# Patient Record
Sex: Male | Born: 1940 | Race: White | Hispanic: No | Marital: Married | Smoking: Former smoker
Health system: Southern US, Community
[De-identification: ages and names within clinical notes are randomized; demographics above are authoritative.]

## PROBLEM LIST (undated history)

## (undated) DIAGNOSIS — M1990 Unspecified osteoarthritis, unspecified site: Secondary | ICD-10-CM

## (undated) DIAGNOSIS — K219 Gastro-esophageal reflux disease without esophagitis: Secondary | ICD-10-CM

## (undated) DIAGNOSIS — D126 Benign neoplasm of colon, unspecified: Secondary | ICD-10-CM

## (undated) DIAGNOSIS — R739 Hyperglycemia, unspecified: Secondary | ICD-10-CM

## (undated) DIAGNOSIS — K5730 Diverticulosis of large intestine without perforation or abscess without bleeding: Secondary | ICD-10-CM

## (undated) DIAGNOSIS — G4730 Sleep apnea, unspecified: Secondary | ICD-10-CM

## (undated) DIAGNOSIS — M722 Plantar fascial fibromatosis: Secondary | ICD-10-CM

## (undated) DIAGNOSIS — E785 Hyperlipidemia, unspecified: Secondary | ICD-10-CM

## (undated) DIAGNOSIS — H269 Unspecified cataract: Secondary | ICD-10-CM

## (undated) DIAGNOSIS — G4733 Obstructive sleep apnea (adult) (pediatric): Secondary | ICD-10-CM

## (undated) DIAGNOSIS — T4145XA Adverse effect of unspecified anesthetic, initial encounter: Secondary | ICD-10-CM

## (undated) HISTORY — PX: CATARACT EXTRACTION, BILATERAL: SHX1313

## (undated) HISTORY — DX: Unspecified cataract: H26.9

## (undated) HISTORY — DX: Sleep apnea, unspecified: G47.30

## (undated) HISTORY — PX: SHOULDER SURGERY: SHX246

## (undated) HISTORY — DX: Obstructive sleep apnea (adult) (pediatric): G47.33

## (undated) HISTORY — DX: Benign neoplasm of colon, unspecified: D12.6

## (undated) HISTORY — DX: Unspecified osteoarthritis, unspecified site: M19.90

## (undated) HISTORY — DX: Diverticulosis of large intestine without perforation or abscess without bleeding: K57.30

## (undated) HISTORY — DX: Gastro-esophageal reflux disease without esophagitis: K21.9

## (undated) HISTORY — PX: SEPTOPLASTY: SUR1290

## (undated) HISTORY — DX: Hyperglycemia, unspecified: R73.9

## (undated) HISTORY — PX: OTHER SURGICAL HISTORY: SHX169

## (undated) HISTORY — DX: Hyperlipidemia, unspecified: E78.5

---

## 1991-03-24 ENCOUNTER — Encounter: Payer: Self-pay | Admitting: Family Medicine

## 1995-03-14 ENCOUNTER — Encounter: Payer: Self-pay | Admitting: Family Medicine

## 1995-03-14 LAB — CONVERTED CEMR LAB
Blood Glucose, Fasting: 93 mg/dL
RBC count: 5.02 10*6/uL
TSH: 2.64 microintl units/mL
WBC, blood: 4.9 10*3/uL

## 1995-06-02 DIAGNOSIS — E785 Hyperlipidemia, unspecified: Secondary | ICD-10-CM

## 1995-06-02 HISTORY — DX: Hyperlipidemia, unspecified: E78.5

## 1996-06-20 ENCOUNTER — Encounter: Payer: Self-pay | Admitting: Family Medicine

## 1997-07-13 ENCOUNTER — Encounter: Payer: Self-pay | Admitting: Family Medicine

## 1997-07-13 LAB — CONVERTED CEMR LAB: RBC count: 4.71 10*6/uL

## 1998-01-25 ENCOUNTER — Encounter: Payer: Self-pay | Admitting: Family Medicine

## 1998-01-25 LAB — CONVERTED CEMR LAB: RBC count: 5.02 10*6/uL

## 1998-03-12 ENCOUNTER — Encounter: Payer: Self-pay | Admitting: Family Medicine

## 1998-10-15 ENCOUNTER — Ambulatory Visit (HOSPITAL_COMMUNITY): Admission: RE | Admit: 1998-10-15 | Discharge: 1998-10-15 | Payer: Self-pay | Admitting: Family Medicine

## 1998-10-16 ENCOUNTER — Encounter: Payer: Self-pay | Admitting: Family Medicine

## 1998-10-16 LAB — CONVERTED CEMR LAB
Blood Glucose, Fasting: 119 mg/dL
TSH: 1.51 microintl units/mL

## 1999-01-18 ENCOUNTER — Encounter: Payer: Self-pay | Admitting: Family Medicine

## 1999-01-18 LAB — CONVERTED CEMR LAB: Blood Glucose, Fasting: 111 mg/dL

## 1999-04-19 ENCOUNTER — Encounter: Payer: Self-pay | Admitting: Family Medicine

## 1999-04-19 LAB — CONVERTED CEMR LAB: Blood Glucose, Fasting: 115 mg/dL

## 1999-09-01 HISTORY — PX: CIRCUMCISION: SUR203

## 2000-04-30 ENCOUNTER — Encounter: Payer: Self-pay | Admitting: Family Medicine

## 2000-04-30 LAB — CONVERTED CEMR LAB
Blood Glucose, Fasting: 123 mg/dL
PSA: 0.3 ng/mL

## 2000-10-02 ENCOUNTER — Encounter: Payer: Self-pay | Admitting: Family Medicine

## 2001-03-17 ENCOUNTER — Encounter: Payer: Self-pay | Admitting: Family Medicine

## 2003-03-06 ENCOUNTER — Ambulatory Visit (HOSPITAL_COMMUNITY): Admission: RE | Admit: 2003-03-06 | Discharge: 2003-03-06 | Payer: Self-pay | Admitting: Orthopedic Surgery

## 2003-03-06 ENCOUNTER — Ambulatory Visit (HOSPITAL_BASED_OUTPATIENT_CLINIC_OR_DEPARTMENT_OTHER): Admission: RE | Admit: 2003-03-06 | Discharge: 2003-03-06 | Payer: Self-pay | Admitting: Orthopedic Surgery

## 2003-07-20 ENCOUNTER — Encounter: Payer: Self-pay | Admitting: Family Medicine

## 2003-07-20 LAB — CONVERTED CEMR LAB
Microalbumin U total vol: 4.8 mg/L
TSH: 1.77 microintl units/mL

## 2003-08-02 ENCOUNTER — Encounter: Admission: RE | Admit: 2003-08-02 | Discharge: 2003-08-02 | Payer: Self-pay | Admitting: Family Medicine

## 2004-08-28 ENCOUNTER — Ambulatory Visit (HOSPITAL_COMMUNITY): Admission: RE | Admit: 2004-08-28 | Discharge: 2004-08-28 | Payer: Self-pay | Admitting: Orthopedic Surgery

## 2004-08-28 ENCOUNTER — Ambulatory Visit (HOSPITAL_BASED_OUTPATIENT_CLINIC_OR_DEPARTMENT_OTHER): Admission: RE | Admit: 2004-08-28 | Discharge: 2004-08-28 | Payer: Self-pay | Admitting: Orthopedic Surgery

## 2004-09-22 ENCOUNTER — Emergency Department (HOSPITAL_COMMUNITY): Admission: EM | Admit: 2004-09-22 | Discharge: 2004-09-22 | Payer: Self-pay | Admitting: Emergency Medicine

## 2004-09-23 ENCOUNTER — Encounter: Payer: Self-pay | Admitting: Family Medicine

## 2004-09-23 ENCOUNTER — Ambulatory Visit: Payer: Self-pay | Admitting: Family Medicine

## 2004-09-23 LAB — CONVERTED CEMR LAB: WBC, blood: 7.9 10*3/uL

## 2004-09-25 ENCOUNTER — Observation Stay: Payer: Self-pay

## 2004-12-01 HISTORY — PX: KNEE ARTHROSCOPY: SUR90

## 2005-03-03 DIAGNOSIS — K5730 Diverticulosis of large intestine without perforation or abscess without bleeding: Secondary | ICD-10-CM

## 2005-03-03 HISTORY — DX: Diverticulosis of large intestine without perforation or abscess without bleeding: K57.30

## 2005-03-04 ENCOUNTER — Ambulatory Visit: Payer: Self-pay | Admitting: Internal Medicine

## 2005-03-10 ENCOUNTER — Encounter (INDEPENDENT_AMBULATORY_CARE_PROVIDER_SITE_OTHER): Payer: Self-pay | Admitting: Specialist

## 2005-03-10 ENCOUNTER — Ambulatory Visit: Payer: Self-pay | Admitting: Internal Medicine

## 2005-03-12 ENCOUNTER — Encounter: Admission: RE | Admit: 2005-03-12 | Discharge: 2005-03-12 | Payer: Self-pay | Admitting: *Deleted

## 2005-03-14 ENCOUNTER — Ambulatory Visit (HOSPITAL_BASED_OUTPATIENT_CLINIC_OR_DEPARTMENT_OTHER): Admission: RE | Admit: 2005-03-14 | Discharge: 2005-03-14 | Payer: Self-pay | Admitting: *Deleted

## 2005-10-01 DIAGNOSIS — K219 Gastro-esophageal reflux disease without esophagitis: Secondary | ICD-10-CM

## 2005-10-01 HISTORY — DX: Gastro-esophageal reflux disease without esophagitis: K21.9

## 2005-10-18 ENCOUNTER — Emergency Department (HOSPITAL_COMMUNITY): Admission: EM | Admit: 2005-10-18 | Discharge: 2005-10-18 | Payer: Self-pay | Admitting: Emergency Medicine

## 2005-10-18 HISTORY — PX: UMBILICAL HERNIA REPAIR: SHX196

## 2005-12-08 ENCOUNTER — Ambulatory Visit: Payer: Self-pay | Admitting: Internal Medicine

## 2006-01-06 ENCOUNTER — Ambulatory Visit: Payer: Self-pay | Admitting: Family Medicine

## 2006-01-06 LAB — CONVERTED CEMR LAB
Alkaline Phosphatase: 101 units/L (ref 39–117)
Blood Glucose, Fasting: 117 mg/dL
Chol/HDL Ratio, serum: 4.2
Cholesterol: 170 mg/dL (ref 0–200)
Creatinine,U: 274.4 mg/dL
Glucose, Bld: 117 mg/dL — ABNORMAL HIGH (ref 70–99)
HDL: 40.9 mg/dL (ref >39.0)
Microalb Creat Ratio: 4 mg/g (ref 0.0–30.0)
Microalb, Ur: 1.1 mg/dL (ref 0.0–1.9)
TSH: 1.58 microintl units/mL (ref 0.35–5.50)
Total Protein: 6.5 g/dL (ref 6.0–8.3)
VLDL: 13 mg/dL (ref 0–40)

## 2006-01-08 ENCOUNTER — Ambulatory Visit: Payer: Self-pay | Admitting: Family Medicine

## 2006-03-17 ENCOUNTER — Ambulatory Visit: Payer: Self-pay | Admitting: Family Medicine

## 2006-04-13 ENCOUNTER — Ambulatory Visit: Payer: Self-pay | Admitting: Family Medicine

## 2006-05-18 ENCOUNTER — Ambulatory Visit: Payer: Self-pay | Admitting: Family Medicine

## 2006-08-28 ENCOUNTER — Telehealth: Payer: Self-pay | Admitting: Family Medicine

## 2006-09-02 ENCOUNTER — Ambulatory Visit: Payer: Self-pay | Admitting: Family Medicine

## 2006-09-02 LAB — CONVERTED CEMR LAB
ALT: 27 units/L (ref 0–53)
AST: 27 units/L (ref 0–37)
Cholesterol: 170 mg/dL (ref 0–200)

## 2006-09-07 ENCOUNTER — Telehealth: Payer: Self-pay | Admitting: Family Medicine

## 2006-10-21 ENCOUNTER — Ambulatory Visit: Payer: Self-pay | Admitting: Family Medicine

## 2006-12-12 ENCOUNTER — Encounter: Payer: Self-pay | Admitting: Family Medicine

## 2006-12-12 ENCOUNTER — Ambulatory Visit: Payer: Self-pay | Admitting: Family Medicine

## 2006-12-16 ENCOUNTER — Ambulatory Visit: Payer: Self-pay | Admitting: Family Medicine

## 2006-12-16 LAB — CONVERTED CEMR LAB: Hgb A1c MFr Bld: 5.9 % (ref 4.6–6.0)

## 2007-01-04 ENCOUNTER — Ambulatory Visit: Payer: Self-pay | Admitting: Family Medicine

## 2007-01-24 ENCOUNTER — Emergency Department (HOSPITAL_COMMUNITY): Admission: EM | Admit: 2007-01-24 | Discharge: 2007-01-24 | Payer: Self-pay | Admitting: Family Medicine

## 2007-04-27 ENCOUNTER — Ambulatory Visit: Payer: Self-pay | Admitting: Family Medicine

## 2007-04-27 LAB — CONVERTED CEMR LAB
ALT: 25 units/L (ref 0–53)
AST: 24 units/L (ref 0–37)
Calcium: 9.5 mg/dL (ref 8.4–10.5)
Chloride: 103 meq/L (ref 96–112)
Creatinine,U: 132.2 mg/dL
GFR calc non Af Amer: 71 mL/min
Glucose, Bld: 111 mg/dL — ABNORMAL HIGH (ref 70–99)
HDL: 50.2 mg/dL (ref >39.0)
Microalb, Ur: 0.2 mg/dL (ref 0.0–1.9)
Potassium: 4.9 meq/L (ref 3.5–5.1)
Sodium: 140 meq/L (ref 135–145)
TSH: 1.99 microintl units/mL (ref 0.35–5.50)
Triglycerides: 95 mg/dL (ref 0–149)

## 2007-04-29 ENCOUNTER — Ambulatory Visit: Payer: Self-pay | Admitting: Family Medicine

## 2007-04-29 ENCOUNTER — Encounter: Payer: Self-pay | Admitting: Family Medicine

## 2007-04-29 DIAGNOSIS — K5732 Diverticulitis of large intestine without perforation or abscess without bleeding: Secondary | ICD-10-CM

## 2007-04-29 DIAGNOSIS — N400 Enlarged prostate without lower urinary tract symptoms: Secondary | ICD-10-CM | POA: Insufficient documentation

## 2007-04-29 DIAGNOSIS — E785 Hyperlipidemia, unspecified: Secondary | ICD-10-CM

## 2007-04-29 DIAGNOSIS — K219 Gastro-esophageal reflux disease without esophagitis: Secondary | ICD-10-CM | POA: Insufficient documentation

## 2007-04-29 DIAGNOSIS — E749 Disorder of carbohydrate metabolism, unspecified: Secondary | ICD-10-CM | POA: Insufficient documentation

## 2007-05-05 ENCOUNTER — Telehealth: Payer: Self-pay | Admitting: Family Medicine

## 2007-05-11 ENCOUNTER — Ambulatory Visit: Payer: Self-pay | Admitting: Family Medicine

## 2007-05-11 LAB — FECAL OCCULT BLOOD, GUAIAC: Fecal Occult Blood: NEGATIVE

## 2007-05-17 ENCOUNTER — Encounter (INDEPENDENT_AMBULATORY_CARE_PROVIDER_SITE_OTHER): Payer: Self-pay | Admitting: *Deleted

## 2007-06-21 ENCOUNTER — Telehealth: Payer: Self-pay | Admitting: Family Medicine

## 2007-10-25 ENCOUNTER — Ambulatory Visit: Payer: Self-pay | Admitting: Family Medicine

## 2007-10-25 DIAGNOSIS — R739 Hyperglycemia, unspecified: Secondary | ICD-10-CM

## 2007-10-27 ENCOUNTER — Ambulatory Visit: Payer: Self-pay | Admitting: Family Medicine

## 2008-02-09 ENCOUNTER — Telehealth (INDEPENDENT_AMBULATORY_CARE_PROVIDER_SITE_OTHER): Payer: Self-pay | Admitting: *Deleted

## 2008-02-10 ENCOUNTER — Encounter: Payer: Self-pay | Admitting: Family Medicine

## 2008-03-17 ENCOUNTER — Encounter: Payer: Self-pay | Admitting: Family Medicine

## 2008-03-31 ENCOUNTER — Ambulatory Visit: Payer: Self-pay | Admitting: Family Medicine

## 2008-04-03 ENCOUNTER — Telehealth: Payer: Self-pay | Admitting: Family Medicine

## 2008-05-09 ENCOUNTER — Ambulatory Visit: Payer: Self-pay | Admitting: Family Medicine

## 2008-05-09 LAB — CONVERTED CEMR LAB
Albumin: 3.8 g/dL (ref 3.5–5.2)
Alkaline Phosphatase: 69 units/L (ref 39–117)
BUN: 18 mg/dL (ref 6–23)
Basophils Relative: 0.7 % (ref 0.0–3.0)
Calcium: 9.3 mg/dL (ref 8.4–10.5)
Creatinine,U: 237.1 mg/dL
Eosinophils Absolute: 0.1 10*3/uL (ref 0.0–0.7)
Eosinophils Relative: 3 % (ref 0.0–5.0)
HCT: 43.8 % (ref 39.0–52.0)
Hemoglobin: 14.9 g/dL (ref 13.0–17.0)
Hgb A1c MFr Bld: 5.9 % (ref 4.6–6.0)
MCV: 90.4 fL (ref 78.0–100.0)
Microalb Creat Ratio: 1.3 mg/g (ref 0.0–30.0)
Neutro Abs: 1.9 10*3/uL (ref 1.4–7.7)
Neutrophils Relative %: 49.4 % (ref 43.0–77.0)
PSA: 0.37 ng/mL (ref 0.10–4.00)
Phosphorus: 4.2 mg/dL (ref 2.3–4.6)
Platelets: 146 10*3/uL — ABNORMAL LOW (ref 150–400)
Total Bilirubin: 1.1 mg/dL (ref 0.3–1.2)
Total Protein: 6.7 g/dL (ref 6.0–8.3)
Triglycerides: 112 mg/dL (ref 0–149)
WBC: 3.8 10*3/uL — ABNORMAL LOW (ref 4.5–10.5)

## 2008-05-15 ENCOUNTER — Ambulatory Visit: Payer: Self-pay | Admitting: Family Medicine

## 2008-08-21 ENCOUNTER — Telehealth: Payer: Self-pay | Admitting: Family Medicine

## 2008-11-15 ENCOUNTER — Ambulatory Visit: Payer: Self-pay | Admitting: Family Medicine

## 2008-11-19 LAB — CONVERTED CEMR LAB: Hgb A1c MFr Bld: 5.9 % (ref 4.6–6.5)

## 2008-11-20 ENCOUNTER — Ambulatory Visit: Payer: Self-pay | Admitting: Family Medicine

## 2009-02-16 ENCOUNTER — Ambulatory Visit: Payer: Self-pay | Admitting: Family Medicine

## 2009-02-20 ENCOUNTER — Telehealth (INDEPENDENT_AMBULATORY_CARE_PROVIDER_SITE_OTHER): Payer: Self-pay | Admitting: Internal Medicine

## 2009-03-23 ENCOUNTER — Ambulatory Visit (HOSPITAL_COMMUNITY): Admission: RE | Admit: 2009-03-23 | Discharge: 2009-03-23 | Payer: Self-pay | Admitting: Orthopedic Surgery

## 2009-03-30 ENCOUNTER — Encounter: Payer: Self-pay | Admitting: Family Medicine

## 2009-04-05 ENCOUNTER — Ambulatory Visit: Payer: Self-pay | Admitting: Otolaryngology

## 2009-04-18 ENCOUNTER — Ambulatory Visit: Payer: Self-pay | Admitting: Otolaryngology

## 2009-05-14 ENCOUNTER — Ambulatory Visit: Payer: Self-pay | Admitting: Family Medicine

## 2009-05-14 LAB — CONVERTED CEMR LAB
ALT: 29 units/L (ref 0–53)
AST: 29 units/L (ref 0–37)
Alkaline Phosphatase: 87 units/L (ref 39–117)
Basophils Relative: 0.5 % (ref 0.0–3.0)
Bilirubin, Direct: 0.1 mg/dL (ref 0.0–0.3)
Calcium: 9.2 mg/dL (ref 8.4–10.5)
Chloride: 109 meq/L (ref 96–112)
Creatinine,U: 175.2 mg/dL
Eosinophils Relative: 3.2 % (ref 0.0–5.0)
GFR calc non Af Amer: 79.11 mL/min (ref >60)
Hemoglobin: 14.4 g/dL (ref 13.0–17.0)
Hgb A1c MFr Bld: 5.9 % (ref 4.6–6.5)
LDL Cholesterol: 100 mg/dL — ABNORMAL HIGH (ref 0–99)
Lymphocytes Relative: 36.9 % (ref 12.0–46.0)
Lymphs Abs: 1.5 10*3/uL (ref 0.7–4.0)
Microalb Creat Ratio: 3.4 mg/g (ref 0.0–30.0)
Microalb, Ur: 0.6 mg/dL (ref 0.0–1.9)
Monocytes Relative: 13.1 % — ABNORMAL HIGH (ref 3.0–12.0)
Neutro Abs: 1.9 10*3/uL (ref 1.4–7.7)
Neutrophils Relative %: 46.3 % (ref 43.0–77.0)
PSA: 0.42 ng/mL (ref 0.10–4.00)
Platelets: 142 10*3/uL — ABNORMAL LOW (ref 150.0–400.0)
Potassium: 4.7 meq/L (ref 3.5–5.1)
RBC: 4.74 M/uL (ref 4.22–5.81)
Total Bilirubin: 0.9 mg/dL (ref 0.3–1.2)
WBC: 4 10*3/uL — ABNORMAL LOW (ref 4.5–10.5)

## 2009-05-17 ENCOUNTER — Ambulatory Visit: Payer: Self-pay | Admitting: Family Medicine

## 2009-07-17 ENCOUNTER — Ambulatory Visit: Payer: Self-pay | Admitting: Family Medicine

## 2009-07-17 DIAGNOSIS — M7020 Olecranon bursitis, unspecified elbow: Secondary | ICD-10-CM

## 2009-07-19 ENCOUNTER — Ambulatory Visit: Payer: Self-pay | Admitting: Family Medicine

## 2009-10-04 ENCOUNTER — Encounter (INDEPENDENT_AMBULATORY_CARE_PROVIDER_SITE_OTHER): Payer: Self-pay | Admitting: *Deleted

## 2009-12-03 ENCOUNTER — Ambulatory Visit: Payer: Self-pay | Admitting: Family Medicine

## 2010-01-17 ENCOUNTER — Ambulatory Visit: Payer: Self-pay | Admitting: Family Medicine

## 2010-01-17 LAB — CONVERTED CEMR LAB: Hgb A1c MFr Bld: 6.2 % (ref 4.6–6.5)

## 2010-02-11 ENCOUNTER — Encounter: Payer: Self-pay | Admitting: Family Medicine

## 2010-03-13 ENCOUNTER — Encounter: Payer: Self-pay | Admitting: Internal Medicine

## 2010-03-15 ENCOUNTER — Encounter (INDEPENDENT_AMBULATORY_CARE_PROVIDER_SITE_OTHER): Payer: Self-pay | Admitting: *Deleted

## 2010-04-04 NOTE — Assessment & Plan Note (Signed)
Summary: 6 MTH FU/CLE   Vital Signs:  Patient profile:   70 year old male Weight:      250 pounds Temp:     98.2 degrees F oral Pulse rate:   60 / minute Pulse rhythm:   regular BP sitting:   110 / 72  (left arm) Cuff size:   large  Vitals Entered By: Sydell Axon LPN (January 17, 2010 9:16 AM) CC: 6 Month follow-up   History of Present Illness: Pt here for 6 mos recheck. He feels well and is doing ok.  He has stopped curls on a cushion.  He occas has pins into LLQ abd area. He thinks it might be after eating peanuts...poss diverticular irritation.   Allergies: No Known Drug Allergies  Diabetes Management Exam:    Foot Exam (with socks and/or shoes not present):       Sensory-Pinprick/Light touch:          Left medial foot (L-4): normal          Left dorsal foot (L-5): normal          Left lateral foot (S-1): normal          Right medial foot (L-4): normal          Right dorsal foot (L-5): normal          Right lateral foot (S-1): normal       Sensory-Monofilament:          Left foot: normal          Right foot: normal       Inspection:          Left foot: normal          Right foot: normal       Nails:          Left foot: normal          Right foot: normal   Impression & Recommendations:  Problem # 1:  AODM (ICD-250.00) Assessment Unchanged  Does not check sugar as control has been great. Will recheck today, if A1C less than 6, cont as is. If >6, start monitoring. His updated medication list for this problem includes:    Bayer Low Strength 81 Mg Tbec (Aspirin) .[name]... 1daily by mouth  Orders: Venipuncture (38756) TLB-A1C / Hgb A1C (Glycohemoglobin) (83036-A1C)  Labs Reviewed: Creat: 1.0 (05/14/2009)   Microalbumin: 4.0 (01/06/2006)  Last Eye Exam: normal (10/11/2008) Reviewed HgBA1c results: 5.9 (05/14/2009)  5.9 (11/15/2008)  Problem # 2:  OLECRANON BURSITIS, RIGHT (ICD-726.33) Assessment: Improved Doing better.  Problem # 3:  DIVERTICULOSIS, COLON  (ICD-562.10) Assessment: Unchanged Avoid nuts!  Complete Medication List: 1)  Bayer Low Strength 81 Mg Tbec (Aspirin) .[name].. 1daily by mouth 2)  Prilosec Otc 20 Mg Tbec (Omeprazole magnesium) .... As needed // 3)  Multivitamins Tabs (Multiple vitamin) .[name].. 1 daily by mouth 4)  Pravastatin Sodium 80 Mg Tabs (Pravastatin sodium) .[name].. 1 at bedtime daily by mouth 5)  Celebrex 200 Mg Caps (Celecoxib) .[name].. 1 capsule twice a day as needed 6)  Tylenol Arthritis Pain 650 Mg Cr-tabs (Acetaminophen) .... As needed  Patient Instructions: 1)  RTC 6 mos for Comp Exam, labs prior.   Orders Added: 1)  Venipuncture [36415] 2)  TLB-A1C / Hgb A1C (Glycohemoglobin) [83036-A1C] 3)  Est. Patient Level III [43329]    Current Allergies (reviewed today): No known allergies   Appended Document: 6 MTH FU/CLE     Clinical Lists Changes  Observations: Added new  observation of DMEYEEXAMNXT: 02/2011 (02/13/2010 7:42) Added new observation of DMEYEEXMRES: normal (02/11/2010 7:42) Added new observation of EYE EXAM BY: Dr Orlander Picker (02/11/2010 7:42) Added new observation of DIAB EYE EX: normal (02/11/2010 7:42)        Diabetes Management Exam:    Eye Exam:       Eye Exam done elsewhere          Date: 02/11/2010          Results: normal          Done by: Dr Dwain Picker

## 2010-04-04 NOTE — Letter (Signed)
Summary: Nadara Eaton letter  [place] at [facility]  [address] [place], [place] [address]   Phone: [phone]  Fax: [phone]       10/04/2009 MRN: [id number]  [facility] [place] [address] [place], [place]  [address]  [place]  Dear Sergio [name],  [facility] [place] [facility] [place], and [facility] announce the retirement of Sergio [name], M.D., from full-time practice at the [facility] office effective August 30, 2009 and his plans of returning part-time.  It is important to Sergio [name] and to our practice that you understand that [facility] has seven physicians in our office for your health care needs.  We will continue to offer the same exceptional care that you have today.    Sergio [name] has spoken to many of you about his plans for retirement and returning part-time in the fall.   We will continue to work with you through the transition to schedule appointments for you in the office and meet the high standards that [place] is committed to.   Again, it is with great pleasure that we share the news that Sergio [name] will return to [facility] in October of 2011 with a reduced schedule.    If you have any questions, or would like to request an appointment with one of our physicians, please call us at [phone] and press the option for Scheduling an appointment.  We take pleasure in providing you with excellent patient care and look forward to seeing you at your next office visit.  Our [facility] Physicians are:  Sergio [name], M.D. Sergio [name], M.D. Sergio [name], M.D. Sergio [name], M.D. Sergio [name], M.D. Sergio [name], M.D. We proudly welcomed Sergio [name], M.D. and Sergio [name], M.D. to the practice in July/August 2011.  Sincerely,   [facility]

## 2010-04-04 NOTE — Letter (Signed)
Summary: Dr.Kathryn Hecker,Diabetic and General Eye Exam Report  Dr.Kathryn Hecker,Diabetic and General Eye Exam Report   Imported By: Beau Fanny 02/14/2010 09:36:32  _____________________________________________________________________  External Attachment:    Type:   Image     Comment:   External Document

## 2010-04-04 NOTE — Assessment & Plan Note (Signed)
Summary: CPX / LFW   Vital Signs:  Patient profile:   70 year old male Weight:      256 pounds Temp:     98.7 degrees F oral Pulse rate:   72 / minute Pulse rhythm:   regular BP sitting:   118 / 78  (left arm) Cuff size:   large  Vitals Entered By: Sydell Axon LPN (2009-06-08 8:27 AM) CC: 30 Minute checkup, had a colonoscopy 01/07 by Dr. Juanda Chance   History of Present Illness: Pt here for Comp Exam, has lost 8 pounds since last visit. He has cut out sweets and has lost. His wife is trying to help. His glucose is up slightly. He had surgery by ENT and the lymph node had abnormal activity from past infections...removed a bunch of lymph nodes.   Preventive Screening-Counseling & Management  Alcohol-Tobacco     Alcohol drinks/day: 1     Alcohol type: wine occas martini weekly     >5/day in last 3 mos: no     Smoking Status: quit > 6 months     Packs/Day: <0.25     Year Started: 1980s     Year Quit: 2007     Pack years: cigars  15 per month for 30     Cigars/week: 0     Passive Smoke Exposure: no  Caffeine-Diet-Exercise     Caffeine use/day: 3     Does Patient Exercise: yes     Type of exercise: aerobic and anaerobic     Exercise (avg: min/session): >60     Times/week: <3  Problems Prior to Update: 1)  Uri  (ICD-465.9) 2)  Need Prophylactic Vaccination&inoculation Flu  (ICD-V04.81) 3)  Aodm  (ICD-250.00) 4)  Special Screening Malig Neoplasms Other Sites  (ICD-V76.49) 5)  Hypertrophy Prostate w/o Ur Obst & Oth Luts (DR WOLFE)  (ICD-600.00) 6)  Gerd  (ICD-530.81) 7)  Hyperlipidemia  (ICD-272.4) 8)  Diverticulosis, Colon  (ICD-562.10) 9)  Health Maintenance Exam  (ICD-V70.0) 10)  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 1daily By Mouth 2)  Prilosec Otc 20 Mg  Tbec (Omeprazole Magnesium) .... As Needed // 3)   Multivitamins   Tabs (Multiple Vitamin) .[name].. 1 Daily By Mouth 4)  Pravastatin Sodium 80 Mg Tabs (Pravastatin Sodium) .[name].. 1 At Bedtime Daily By Mouth 5)  Celebrex 200 Mg Caps (Celecoxib) .[name].. 1 Capsule Twice A Day As Needed 6)  Tylenol Arthritis Pain 650 Mg Cr-Tabs (Acetaminophen) .... As Needed 7)  Mucinex Dm Maximum Strength 60-1200 Mg Xr12h-Tab (Dextromethorphan-Guaifenesin) .... Otc As Directed. 8)  Amoxicillin 500 Mg Caps (Amoxicillin) .... Take 2 Two Times A Day  Allergies: No Known Drug Allergies  Past History:  Past Medical History: Last updated: 04/29/2007 Diverticulosis, colon:(03/2005)  VIA COLONOSCOPY Hyperlipidemia: 06/1995) STARTED PRAVACHOL GERD:(10/2005)  Past Surgical History: Last updated: 02/20/2009 MEDIASTINOSCOPY COLONOSCOPY SCREENING --NORMAL :(DR. Maryruth Bun) :(1991) PALATOPLASTY, TONSILS AND ADENOIDS SEPTOPLASTYDUE SLEEP APNEA CIRCUMCISION (DR WOLFE) 09/1999 LEFT KNEE ARTHRASCOPY (DR. ROWAN):04/2003) MRI BRAIN NORMAL;;--NEGATIVE ACOUSTIC NEUROMA:(08/02/2003) EGD :(03/10/2005) COLONOSCOPY ; POLYP X2 BIOPSY NEGATIVE; DIVERTICULOSIS:(03/10/2005) RIGHT KNEE ARTHROSCOPY:(DR. ROWAN) :(12/2004) UMBIICAL HERNIA REPAIR :(DR. INGRAM):(10/18/2005) CT OF ABDOMEN AND PELVIS SIGMOID DIVERTICULOSIS:(10/18/2005) zostervax given at pharmacy--02/20/2009  Family History: Last updated: 08-Jun-2009 Father:DECEASED AT 86 YOA DM; BURN INFECTION/ PARTIAL AMPUTEE L FOOT   Mother: DECEASED AT 46 YOA CANCER OF LIVER  DIABETIC /GLUCOSE  INTOLERANCE//  BREAST CANCER 6 YEARS PREVIOUSLY BROTHER A (Jim) 70 BROTHER A Bernette Redbird) 63 BROTHER A (Danny) 61 SISTER DECEASED 69 YOA FROM M.S.  SISTER A (Debbi) 59 CV: +GF X 3 IN HIS 90'S HBP: NEGATIVE DM: + DM FATHER // GLUCOSE INTOLERANCE MOTHER GOUT/ARTHRITIS: PROSTATE CANCER: BREAST CANCER + MOTHER// +MOTHER LIVER CANCER COLON CANCER: DEPRESSION: NEGATIVE ETOH/DRUG ABUSE: NEGATIVE OTHER: NEGATIVE STROKE  Social History: Last updated:  04/29/2007 Marital Status: Married DIVORCED// REMARRIED 03/1995 Children: 2 CHILDREN Occupation: RETIRED FROM [employer]  Risk Factors: Alcohol Use: 1 (05/17/2009) >5 drinks/d w/in last 3 months: no (05/17/2009) Caffeine Use: 3 (05/17/2009) Exercise: yes (05/17/2009)  Risk Factors: Smoking Status: quit > 6 months (05/17/2009) Packs/Day: <0.25 (05/17/2009) Cigars/wk: 0 (05/17/2009) Passive Smoke Exposure: no (05/17/2009)  Family History: Father:DECEASED AT 88 YOA DM; BURN INFECTION/ PARTIAL AMPUTEE L FOOT   Mother: DECEASED AT 76 YOA CANCER OF LIVER  DIABETIC /GLUCOSE INTOLERANCE//  BREAST CANCER 6 YEARS PREVIOUSLY BROTHER A (Jim) 70 BROTHER A Bernette Redbird) 63 BROTHER A (Danny) 61 SISTER DECEASED 69 YOA FROM M.S.  SISTER A (Debbi) 59 CV: +GF X 3 IN HIS 90'S HBP: NEGATIVE DM: + DM FATHER // GLUCOSE INTOLERANCE MOTHER GOUT/ARTHRITIS: PROSTATE CANCER: BREAST CANCER + MOTHER// +MOTHER LIVER CANCER COLON CANCER: DEPRESSION: NEGATIVE ETOH/DRUG ABUSE: NEGATIVE OTHER: NEGATIVE STROKE  Review of Systems General:  Denies chills, fatigue, fever, sweats, weakness, and weight loss. Eyes:  Denies blurring, discharge, and eye pain. ENT:  Denies decreased hearing, ear discharge, and earache. CV:  Denies chest pain or discomfort, fainting, fatigue, palpitations, shortness of breath with exertion, and swelling of feet. Resp:  Denies cough, shortness of breath, and wheezing. GI:  Denies abdominal pain, bloody stools, change in bowel habits, constipation, dark tarry stools, diarrhea, indigestion, loss of appetite, nausea, vomiting, vomiting blood, and yellowish skin color. GU:  Denies discharge, dysuria, nocturia, and urinary frequency. MS:  Complains of joint pain and muscle aches; denies joint swelling, cramps, and stiffness; L hip joint, left calf laterally throbs. Derm:  Denies dryness, itching, and rash. Neuro:  Denies numbness, poor balance, tingling, and  tremors.  Physical Exam  General:  Well-developed,well-nourished,in no acute distress; alert,appropriate and cooperative throughout examination, stocky but not obese, thinner than last visit! Head:  Normocephalic and atraumatic without obvious abnormalities. No apparent alopecia or balding. Sinuses nontender. Eyes:  Conjunctiva clear bilaterally.  Ears:  External ear exam shows no significant lesions or deformities.  Otoscopic examination reveals clear canals, tympanic membranes are intact bilaterally without bulging, retraction, inflammation or discharge. Hearing is grossly normal bilaterally. Nose:  External nasal examination shows no deformity or inflammation. Nasal mucosa are pink and moist without lesions or exudates. Mouth:  Oral mucosa and oropharynx without lesions or exudates.  Teeth in good repair. Neck:  No deformities, masses, or tenderness noted. Surgical scar along the right ant cerv chain. Chest Wall:  No deformities, masses, tenderness or gynecomastia noted. Breasts:  No masses or gynecomastia noted Lungs:  Normal respiratory effort, chest expands symmetrically. Lungs are clear to auscultation, no crackles or wheezes. Heart:  Normal rate and regular rhythm. S1 and S2 normal without gallop, murmur, click, rub or other extra sounds. Abdomen:  Bowel sounds positive,abdomen soft and non-tender without masses, organomegaly or hernias noted. Rectal:  No external abnormalities noted. Normal sphincter tone. No rectal masses or tenderness. G neg. Genitalia:  Testes bilaterally descended without nodularity, tenderness or masses. No scrotal masses or lesions. No penis lesions or urethral discharge. Prostate:  Prostate gland firm and smooth, no enlargement, nodularity, tenderness, mass, asymmetry or induration. 10-20gms. Msk:  No deformity or scoliosis noted of thoracic or lumbar spine.   Pulses:  R and L carotid,radial,femoral,dorsalis pedis and posterior tibial pulses are full and equal  bilaterally Extremities:  No clubbing, cyanosis, edema, or deformity noted with normal full range of motion of all joints.   Neurologic:  No cranial nerve deficits noted. Station and gait are normal. Sensory, motor and coordinative functions appear intact. Skin:  Intact without suspicious lesions or rashes. Fresh surgical scar in right cervical ant chain Cervical Nodes:  No lymphadenopathy noted Inguinal Nodes:  No significant adenopathy Psych:  Cognition and judgment appear intact. Alert and cooperative with normal attention span and concentration. No apparent delusions, illusions, hallucinations  Diabetes Management Exam:    Foot Exam (with socks and/or shoes not present):       Sensory-Pinprick/Light touch:          Left medial foot (L-4): normal          Left dorsal foot (L-5): normal          Left lateral foot (S-1): normal          Right medial foot (L-4): normal          Right dorsal foot (L-5): normal          Right lateral foot (S-1): normal       Sensory-Monofilament:          Left foot: normal          Right foot: normal       Inspection:          Left foot: normal          Right foot: normal       Nails:          Left foot: normal          Right foot: normal    Eye Exam:       Eye Exam done elsewhere          Date: 10/11/2008          Results: normal          Done by: Dr Robbert Picker   Impression & Recommendations:  Problem # 1:  HEALTH MAINTENANCE EXAM (ICD-V70.0) Pneum shot today. Otherimmun UTD. Eye and foot exam UTD. Discussed Diet and exercise.  Problem # 2:  AODM (ICD-250.00) Assessment: Improved Doing great. [place] is to cont. His updated medication list for this problem includes:    Bayer Low Strength 81 Mg Tbec (Aspirin) .[name]... 1daily by mouth  Labs Reviewed: Creat: 1.0 (05/14/2009)   Microalbumin: 4.0 (01/06/2006)  Last Eye Exam: normal (10/11/2008) Reviewed HgBA1c results: 5.9 (05/14/2009)  5.9 (11/15/2008)  Problem # 3:  HYPERTROPHY PROSTATE  W/O UR OBST & OTH LUTS (DR WOLFE) (ICD-600.00) Assessment: Unchanged Stable PSA and exam.  Problem # 4:  GERD (ICD-530.81) Assessment: Unchanged  Stable. His updated medication list for this problem includes:    Prilosec Otc 20 Mg Tbec (Omeprazole magnesium) .[name]... As needed //  Diagnostics Reviewed:  Discussed lifestyle modifications, diet, antacids/medications, and preventive measures. Handout provided.   Problem # 5:  HYPERLIPIDEMIA (ICD-272.4) Assessment: Unchanged Excellent. Improved since his diet and exercise changes. His updated medication list for this problem includes:    Pravastatin Sodium 80 Mg Tabs (Pravastatin sodium) .[name]... 1 at bedtime daily by mouth  Labs Reviewed: SGOT: 29 (05/14/2009)   SGPT: 29 (05/14/2009)   HDL:50.30 (  05/14/2009), 40.2 (05/09/2008)  LDL:100 (05/14/2009), 133 (05/09/2008)  Chol:181 (05/14/2009), 196 (05/09/2008)  Trig:152.0 (05/14/2009), 112 (05/09/2008)  Problem # 6:  DIVERTICULOSIS, COLON (ICD-562.10) Assessment: Unchanged Discussed being seen for discomfort of the LLQ for more than two days.  Complete Medication List: 1)  Bayer Low Strength 81 Mg Tbec (Aspirin) .[name].. 1daily by mouth 2)  Prilosec Otc 20 Mg Tbec (Omeprazole magnesium) .... As needed // 3)  Multivitamins Tabs (Multiple vitamin) .[name].. 1 daily by mouth 4)  Pravastatin Sodium 80 Mg Tabs (Pravastatin sodium) .[name].. 1 at bedtime daily by mouth 5)  Celebrex 200 Mg Caps (Celecoxib) .[name].. 1 capsule twice a day as needed 6)  Tylenol Arthritis Pain 650 Mg Cr-tabs (Acetaminophen) .... As needed  Patient Instructions: 1)  Pneumonia shot today. 2)  RTC in the Fall.  Current Allergies (reviewed today): No known allergies   Appended Document: CPX / LFW   Pneumovax Vaccine    Vaccine Type: Pneumovax (Medicare)    Site: left deltoid    Mfr: Merck    Dose: 0.5 ml    Route: IM    Given by: Sydell Axon LPN    Exp. Date: 10/15/2010    Lot #: [id number]    VIS given: 09/29/95 version given  May 17, 2009.

## 2010-04-04 NOTE — Letter (Signed)
Summary: Pre Visit Letter Revised  [facility]  [address] [place], [place] [address]   Phone: [phone]  Fax: [phone]        03/15/2010 MRN: [id number] [facility] [address] [place], [place]  [address]  [place]             Procedure Date:  April 25, 2010   recall col-Dr Dalene Carrow to the Gastroenterology Division at [facility].    You are scheduled to see a nurse for your pre-procedure visit on April 11, 2010 at 4:30pm on the 3rd floor at [facility], [address]  We ask that you try to arrive at our office 15 minutes prior to your appointment time to allow for check-in.  Please take a minute to review the attached form.  If you answer "Yes" to one or more of the questions on the first page, we ask that you call the person listed at your earliest opportunity.  If you answer "No" to all of the questions, please complete the rest of the form and bring it to your appointment.    Your nurse visit will consist of discussing your medical and surgical history, your immediate family medical history, and your medications.   If you are unable to list all of your medications on the form, please bring the medication bottles to your appointment and we will list them.  We will need to be aware of both prescribed and over the counter drugs.  We will need to know exact dosage information as well.    Please be prepared to read and sign documents such as consent forms, a financial agreement, and acknowledgement forms.  If necessary, and with your consent, a friend or relative is welcome to sit-in on the nurse visit with you.  Please bring your insurance card so that we may make a copy of it.  If your insurance requires a referral to see a specialist, please bring your referral form from your primary care physician.  No co-pay is required for this nurse visit.     If you cannot keep your appointment, please call [phone] to cancel or  reschedule prior to your appointment date.  This allows [place] the opportunity to schedule an appointment for another patient in need of care.    Thank you for choosing [facility] for your medical needs.  We appreciate the opportunity to care for you.  Please visit [place] at our website  to learn more about our practice.  Sincerely, The Gastroenterology Division

## 2010-04-04 NOTE — Assessment & Plan Note (Signed)
Summary: ACUTE RIGHT OLECRANON BURSITIS PER DR SCHALLER/[facility]   Vital Signs:  Patient profile:   70 year old male Height:      72 inches Weight:      253.0 pounds BMI:     34.44 Temp:     98.4 degrees F oral Pulse rate:   64 / minute Pulse rhythm:   regular BP sitting:   100 / 70  (left arm) Cuff size:   large  Vitals Entered By: Benny Lennert CMA Duncan Dull) (Jul 19, 2009 10:07 AM)  History of Present Illness: Chief complaint Acute right olecranon bursitis  R 4 day history of elbow swelling, seen at the request of Dr. Hetty Ely:  The patient's elbow does not hurt at all. It is not red or warm in any way. His right elbow, however does have a boggy, fluid-filled sac. He does not recall any specific trauma or incident. He did notice this while he was at the gym, and working out. Actually, one of the trainers at the gymnasium, noticed that he did have this swelling protuberance.  He has never had this in the past.  REVIEW OF SYSTEMS  GEN: No systemic complaints, no fevers, chills, sweats, or other acute illnesses MSK: Detailed in the HPI GI: tolerating PO intake without difficulty Neuro: No numbness, parasthesias, or tingling associated. Otherwise the pertinent positives of the ROS are noted above.    GEN: Well-developed,well-nourished,in no acute distress; alert,appropriate and cooperative throughout examination HEENT: Normocephalic and atraumatic without obvious abnormalities. No apparent alopecia or balding. Ears, externally no deformities PULM: Breathing comfortably in no respiratory distress EXT: No clubbing, cyanosis, or edema PSYCH: Normally interactive. Cooperative during the interview. Pleasant. Friendly and conversant. Not anxious or depressed appearing. Normal, full affect.    R elbow Ecchymosis or edema: large, boggy olecranon bursa  on the right ROM: full flexion, extension, pronation, supination Shoulder ROM: Full Flexion: 5/5 Extension: 5/5 Supination: 5/5    Pronation: 5/5 Wrist ext: 5/5 Wrist flexion: 5/5 No gross bony abnormality ECRB tenderness: neg Medial epicondyle: NT Lateral epicondyle, resisted wrist extension from wrist full pronation and flexion: NT grip: 5/5  sensation intact  Allergies (verified): No Known Drug Allergies  Past History:  Past medical, surgical, family and social histories (including risk factors) reviewed, and no changes noted (except as noted below).  Past Medical History: Reviewed history from 04/29/2007 and no changes required. Diverticulosis, colon:(03/2005)  VIA COLONOSCOPY Hyperlipidemia: 06/1995) STARTED PRAVACHOL GERD:(10/2005)  Past Surgical History: Reviewed history from 02/20/2009 and no changes required. MEDIASTINOSCOPY COLONOSCOPY SCREENING --NORMAL :(DR. Maryruth Bun) :(1991) PALATOPLASTY, TONSILS AND ADENOIDS SEPTOPLASTYDUE SLEEP APNEA CIRCUMCISION (DR WOLFE) 09/1999 LEFT KNEE ARTHRASCOPY (DR. ROWAN):04/2003) MRI BRAIN NORMAL;;--NEGATIVE ACOUSTIC NEUROMA:(08/02/2003) EGD :(03/10/2005) COLONOSCOPY ; POLYP X2 BIOPSY NEGATIVE; DIVERTICULOSIS:(03/10/2005) RIGHT KNEE ARTHROSCOPY:(DR. ROWAN) :(12/2004) UMBIICAL HERNIA REPAIR :(DR. INGRAM):(10/18/2005) CT OF ABDOMEN AND PELVIS SIGMOID DIVERTICULOSIS:(10/18/2005) zostervax given at pharmacy--02/20/2009  Family History: Reviewed history from 05/17/2009 and no changes required. Father:DECEASED AT 25 YOA DM; BURN INFECTION/ PARTIAL AMPUTEE L FOOT   Mother: DECEASED AT 29 YOA CANCER OF LIVER  DIABETIC /GLUCOSE INTOLERANCE//  BREAST CANCER 6 YEARS PREVIOUSLY BROTHER A (Jim) 70 BROTHER A Bernette Redbird) 63 BROTHER A (Danny) 61 SISTER DECEASED 69 YOA FROM M.S.  SISTER A (Debbi) 59 CV: +GF X 3 IN HIS 90'S HBP: NEGATIVE DM: + DM FATHER // GLUCOSE INTOLERANCE MOTHER GOUT/ARTHRITIS: PROSTATE CANCER: BREAST CANCER + MOTHER// +MOTHER LIVER CANCER COLON CANCER: DEPRESSION: NEGATIVE ETOH/DRUG ABUSE: NEGATIVE OTHER: NEGATIVE STROKE  Social  History: Reviewed history from 04/29/2007  and no changes required. Marital Status: Married DIVORCED// REMARRIED 03/1995 Children: 2 CHILDREN Occupation: RETIRED FROM [employer], [employer]HRS/YR [employer]   Impression & Recommendations:  Problem # 1:  OLECRANON BURSITIS, RIGHT (ICD-726.33)  classic right-sided olecranon bursitis. No known inciting event. Not warm or red  in the slightest degree. given that it was a bloodytap, and I suspect this is a traumatic olecranon bursitis, and that corticosteroid is injected, I am that I placed the patient also prophylactic Keflex.  Olecranon Bursa Aspiration Verbal consent was obtained. Risks, benefits, and alternatives discussed.  Prepped with Betadine x 3 and Ethyl Chloride used for anesthesia. Under sterile conditions, the effected olecranon bursa was aspirated with an 18 gauge needle. 6 cc amount of bloody fluid was aspirated. 1/2 cc of Marcaine 0.5% and 1/2 of Kenalog 40 mg injected into olecranon bursa easily.  A compression bandage was applied and the patient was instructed to continue for 72 hours. Activity mod for the next week.  cc: Dr. Hetty Ely  Orders: Joint Aspirate / Injection, Intermediate (20605) Kenalog 10mg  (up to 2 units) (J3301)  Complete Medication List: 1)  Bayer Low Strength 81 Mg Tbec (Aspirin) .[name].. 1daily by mouth 2)  Prilosec Otc 20 Mg Tbec (Omeprazole magnesium) .... As needed // 3)  Multivitamins Tabs (Multiple vitamin) .[name].. 1 daily by mouth 4)  Pravastatin Sodium 80 Mg Tabs (Pravastatin sodium) .[name].. 1 at bedtime daily by mouth 5)  Celebrex 200 Mg Caps (Celecoxib) .[name].. 1 capsule twice a day as needed 6)  Tylenol Arthritis Pain 650 Mg Cr-tabs (Acetaminophen) .... As needed 7)  Cephalexin 500 Mg Tabs (Cephalexin) .... Take one by mouth 3 times daily Prescriptions: CEPHALEXIN 500 MG  TABS (CEPHALEXIN) take one by mouth 3 times daily  #30 x 0   Entered and Authorized by:   Hannah Beat MD   Signed by:   Hannah Beat MD on 07/19/2009   Method used:   Electronically to        [facility]* (retail)       [address]       [place], [place]  [address]       Ph: [phone]       Fax: [phone]   RxID:   [id number]   Current Allergies (reviewed today): No known allergies

## 2010-04-04 NOTE — Assessment & Plan Note (Signed)
Summary: CHECK ELBOW/CLE   Vital Signs:  Patient profile:   70 year old male Weight:      253.75 pounds Temp:     98.6 degrees F oral Pulse rate:   60 / minute Pulse rhythm:   regular BP sitting:   110 / 70  (left arm) Cuff size:   large  Vitals Entered By: Sydell Axon LPN (Jul 17, 2009 11:30 AM) CC: Check right elbow, a sac of fluid popped up yesterday   History of Present Illness: Pt here for swelling and "balloon" of the right elbow. No h/o trauma. o pain. but hates the look. He was doing curls when he noticed it for the first time.  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 1daily By Mouth 2)  Prilosec Otc 20 Mg  Tbec (Omeprazole Magnesium) .... As Needed // 3)  Multivitamins   Tabs (Multiple Vitamin) .[name].. 1 Daily By Mouth 4)  Pravastatin Sodium 80 Mg Tabs (Pravastatin Sodium) .[name].. 1 At Bedtime Daily By Mouth 5)  Celebrex 200 Mg Caps (Celecoxib) .[name].. 1 Capsule Twice A Day As Needed 6)  Tylenol Arthritis Pain 650 Mg Cr-Tabs (Acetaminophen) .... As Needed  Allergies: No Known Drug Allergies  Physical Exam  Extremities:  Right elbow with classic bursa hypertrophy, no erythema or warmth.    Impression & Recommendations:  Problem # 1:  OLECRANON BURSITIS, RIGHT (ICD-726.33) Assessment New Refer to Dr Patsy Lager or Ortho of his choice.  Complete Medication List: 1)  Bayer Low Strength 81 Mg Tbec (Aspirin) .[name].. 1daily by mouth 2)   Prilosec Otc 20 Mg Tbec (Omeprazole magnesium) .... As needed // 3)  Multivitamins Tabs (Multiple vitamin) .[name].. 1 daily by mouth 4)  Pravastatin Sodium 80 Mg Tabs (Pravastatin sodium) .[name].. 1 at bedtime daily by mouth 5)  Celebrex 200 Mg Caps (Celecoxib) .[name].. 1 capsule twice a day as needed 6)  Tylenol Arthritis Pain 650 Mg Cr-tabs (Acetaminophen) .... As needed  Patient Instructions: 1)  RTC to see Dr Patsy Lager for acute right olecranon bursitis.  Current Allergies (reviewed today): No known allergies

## 2010-04-04 NOTE — Letter (Signed)
Summary: Colonoscopy Letter  [facility]  [address] [place], [place] [address]   Phone: [phone]  Fax: [phone]      March 13, 2010 MRN: [id number]   [facility] [address] [place], [place]  [address]   Dear Mr. GIL,   According to your medical record, it is time for you to schedule a Colonoscopy. The American Cancer Society recommends this procedure as a method to detect early colon cancer. Patients with a family history of colon cancer, or a personal history of colon polyps or inflammatory bowel disease are at increased risk.  This letter has been generated based on the recommendations made at the time of your procedure. If you feel that in your particular situation this may no longer apply, please contact our office.  Please call our office at [phone] to schedule this appointment or to update your records at your earliest convenience.  Thank you for cooperating with [place] to provide you with the very best care possible.   Sincerely,  Hedwig Morton. Juanda Chance, M.D.  [facility] [phone]

## 2010-04-04 NOTE — Letter (Signed)
Summary: Clearance for Surgery Form,Dr.Bennett,[facility]  Clearance for Surgery Form,Dr.Bennett,[facility]   Imported By: Beau Fanny 04/04/2009 09:36:25  _____________________________________________________________________  External Attachment:    Type:   Image     Comment:   External Document

## 2010-04-04 NOTE — Letter (Signed)
Summary: Revised Clearance for Surgery,Dr.Bennett,[facility]  Revised Clearance for Surgery,Dr.Bennett,[facility]   Imported By: Beau Fanny 04/04/2009 11:37:24  _____________________________________________________________________  External Attachment:    Type:   Image     Comment:   External Document

## 2010-04-04 NOTE — Assessment & Plan Note (Signed)
Summary: ?WATER IN RIGHT ELBOW/CLE   Vital Signs:  Patient profile:   70 year old male Height:      72 inches Weight:      257.25 pounds BMI:     35.02 Temp:     98.3 degrees F oral Pulse rate:   66 / minute Pulse rhythm:   regular BP sitting:   110 / 70  (left arm)  Vitals Entered By: Melody Comas (December 03, 2009 3:42 PM) CC: fluid right elbow   History of Present Illness: H/o olecranon bursitis.  s/p aspiration in 5/11.  Second episode started  ~11/17/09 after curling on an elbow support.  "I felt something change in the middle of it."  Swelling soon thereafter.  No h/o erythema.  Gradually resolving in the meantime.  No FCNAV.   Allergies: No Known Drug Allergies  Review of Systems       See HPI.  Otherwise negative.    Physical Exam  General:  NAD R elbow range of motion wnl.  puffy area over olecranon, but not tender to palpation or red.  normal range of motion distally w/o deficit.     Impression & Recommendations:  Problem # 1:  OLECRANON BURSITIS, RIGHT (ICD-726.33) Given the duration, I would not aspirate.  I would not reinject with corticosteroids since it is resolving.  I would avoid curls on a bar.  Relative rest until reduced and then return to exercise.  If curling, I would do so with the arm at the side to decrease pressure on the olecranon.  d/w patient and he understood.  No intervention now. Okay to protect the elbow with a pad in the meantime.   Complete Medication List: 1)  Bayer Low Strength 81 Mg Tbec (Aspirin) .[name].. 1daily by mouth 2)  Prilosec Otc 20 Mg Tbec (Omeprazole magnesium) .... As needed // 3)  Multivitamins Tabs (Multiple vitamin) .[name].. 1 daily by mouth 4)  Pravastatin Sodium 80 Mg Tabs (Pravastatin sodium) .[name].. 1 at bedtime daily by mouth 5)  Celebrex 200 Mg Caps (Celecoxib) .[name].. 1 capsule twice a day as needed 6)  Tylenol Arthritis Pain 650 Mg Cr-tabs (Acetaminophen) .... As needed  Other Orders: Admin 1st Vaccine (40981) Flu  Vaccine 72yrs + [phone])  Patient Instructions: 1)  I would cushion your elbow with a pad and then let us know if it gets bigger.  I would not do curls on an elbow rest.    Current Allergies (reviewed today): No known allergies  Flu Vaccine Consent Questions     Do you have a history of severe allergic reactions to this vaccine? no    Any prior history of allergic reactions to egg and/or gelatin? no    Do you have a sensitivity to the preservative Thimersol? no    Do you have a past history of Guillan-Barre Syndrome? no    Do you currently have an acute febrile illness? no    Have you ever had a severe reaction to latex? no    Vaccine information given and explained to patient? yes    Are you currently pregnant? no    Lot Number:[id number]   Exp Date:08/31/2010   Site Given  Left Deltoid IMlbflu Lugene Fuquay CMA (AAMA)  December 03, 2009 4:23 PM

## 2010-04-04 NOTE — Letter (Signed)
Summary: Schaller Retirement letter  [facility] [place]  [address]   [place], [place] [address]   Phone: [phone]  Fax: [phone]       10/04/2009 MRN: [id number]  Sergio [name] [address] [place], [place]  [address]  [place]  Dear [name],  [facility] [place], and [facility] announce the retirement of ROBERT N. SCHALLER, M.D., from full-time practice at the [place] office effective August 30, 2009 and his plans of returning part-time.  It is important to Dr. Schaller and to our practice that you understand that [facility] [place] has seven physicians in our office for your health care needs.  We will continue to offer the same exceptional care that you have today.    Dr. Schaller has spoken to many of you about his plans for retirement and returning part-time in the fall.   We will continue to work with you through the transition to schedule appointments for you in the office and meet the high standards that [facility] is committed to.   Again, it is with great pleasure that we share the news that Dr. Schaller will return to [facility] [place] in October of 2011 with a reduced schedule.    If you have any questions, or would like to request an appointment with one of our physicians, please call us at [phone] and press the option for Scheduling an appointment.  We take pleasure in providing you with excellent patient care and look forward to seeing you at your next office visit.  Our [place] Physicians are:  Richard Letvak, M.D. Robert Schaller, M.D. Marne Tower, M.D. Amy Bedsole, M.D. Spencer Copland, M.D. Talia Aron, M.D. We proudly welcomed Shaw Duncan, M.D. and Javier Gutierrez, M.D. to the practice in July/August 2011.  Sincerely,  [facility] [place] 

## 2010-04-06 ENCOUNTER — Encounter: Payer: Self-pay | Admitting: Family Medicine

## 2010-04-10 ENCOUNTER — Encounter (INDEPENDENT_AMBULATORY_CARE_PROVIDER_SITE_OTHER): Payer: Self-pay | Admitting: *Deleted

## 2010-04-11 ENCOUNTER — Encounter: Payer: Self-pay | Admitting: Internal Medicine

## 2010-04-18 NOTE — Letter (Signed)
Summary: Miralax Instructions  [facility]  [address]   [place], [place] [address]   Phone: [phone]  Fax: [phone]       DARRYEL DIODATO    May 06, 1940    MRN: [id number]       Procedure Day Dorna Bloom: Thursday, 04-25-10     Arrival Time: 10:00 a.m.     Procedure Time: 11:00 a.m.     Location of Procedure:                    x  [facility] (4th Floor)    PREPARATION FOR COLONOSCOPY WITH MIRALAX  Starting 5 days prior to your procedure 04-20-10 do not eat nuts, seeds, popcorn, corn, beans, peas,  salads, or any raw vegetables.  Do not take any fiber supplements (e.g. Metamucil, Citrucel, and Benefiber). ____________________________________________________________________________________________________   THE DAY BEFORE YOUR PROCEDURE         DATE: 04-24-10 DAY: Wednesday  1   Drink clear liquids the entire day-NO SOLID FOOD  2   Do not drink anything colored red or purple.  Avoid juices with pulp.  No orange juice.  3   Drink at least 64 oz. (8 glasses) of fluid/clear liquids during the day to prevent dehydration and help the prep work efficiently.  CLEAR LIQUIDS INCLUDE: Water Jello Ice Popsicles Tea (sugar ok, no milk/cream) Powdered fruit flavored drinks Coffee (sugar ok, no milk/cream) Gatorade Juice: apple, white grape, white cranberry  Lemonade Clear bullion, consomm, broth Carbonated beverages (any kind) Strained chicken noodle soup Hard Candy  4   Mix the entire bottle of Miralax with 64 oz. of Gatorade/Powerade in the morning and put in the refrigerator to chill.  5   At 3:00 pm take 2 Dulcolax/Bisacodyl tablets.  6   At 4:30 pm take one Reglan/Metoclopramide tablet.  7  Starting at 5:00 pm drink one 8 oz glass of the Miralax mixture every 15-20 minutes until you have finished drinking the entire 64 oz.  You should finish drinking prep around 7:30 or 8:00 pm.  8   If you are nauseated, you may take the 2nd  Reglan/Metoclopramide tablet at 6:30 pm.        9    At 8:00 pm take 2 more DULCOLAX/Bisacodyl tablets.     THE DAY OF YOUR PROCEDURE      DATE: 04-25-10 DAY: Thursday  You may drink clear liquids until 9:00 a.m. (2 HOURS BEFORE PROCEDURE).   MEDICATION INSTRUCTIONS  Unless otherwise instructed, you should take regular prescription medications with a small sip of water as early as possible the morning of your procedure.           OTHER INSTRUCTIONS  You will need a responsible adult at least 69 years of age to accompany you and drive you home.   This person must remain in the waiting room during your procedure.  Wear loose fitting clothing that is easily removed.  Leave jewelry and other valuables at home.  However, you may wish to bring a book to read or an iPod/MP3 player to listen to music as you wait for your procedure to start.  Remove all body piercing jewelry and leave at home.  Total time from sign-in until discharge is approximately 2-3 hours.  You should go home directly after your procedure and rest.  You can resume normal activities the day after your procedure.  The day of your procedure you should not:   Drive   Make legal  decisions   Operate machinery   Drink alcohol   Return to work  You will receive specific instructions about eating, activities and medications before you leave.   The above instructions have been reviewed and explained to me by   Ezra Sites RN  April 11, 2010 4:39 PM     I fully understand and can verbalize these instructions _____________________________ Date _______

## 2010-04-18 NOTE — Miscellaneous (Signed)
Summary: [facility]  Clinical Lists Changes  Medications: Added new medication of MIRALAX   POWD (POLYETHYLENE GLYCOL 3350) As per prep  instructions. - Signed Added new medication of DULCOLAX 5 MG  TBEC (BISACODYL) Day before procedure take 2 at 3pm and 2 at 8pm. - Signed Added new medication of REGLAN 10 MG  TABS (METOCLOPRAMIDE HCL) As per prep instructions. - Signed Rx of MIRALAX   POWD (POLYETHYLENE GLYCOL 3350) As per prep  instructions.;  #255gm x 0;  Signed;  Entered by: Ezra Sites RN;  Authorized by: Hart Carwin MD;  Method used: Electronically to [facility]*, [address], [place], [place]  [address], Ph: [phone], Fax: [phone] Rx of DULCOLAX 5 MG  TBEC (BISACODYL) Day before procedure take 2 at 3pm and 2 at 8pm.;  #4 x 0;  Signed;  Entered by: Ezra Sites RN;  Authorized by: Hart Carwin MD;  Method used: Electronically to [facility]*, [address], [place], [place]  [address], Ph: [phone], Fax: [phone] Rx of REGLAN 10 MG  TABS (METOCLOPRAMIDE HCL) As per prep instructions.;  #2 x 0;  Signed;  Entered by: Ezra Sites RN;  Authorized by: Hart Carwin MD;  Method used: Electronically to [facility]*, [address] [place], [place]  [address], Ph: [phone], Fax: [phone] Observations: Added new observation of NKA: T (04/11/2010 16:16)    Prescriptions: REGLAN 10 MG  TABS (METOCLOPRAMIDE HCL) As per prep instructions.  #2 x 0   Entered by:   Ezra Sites RN   Authorized by:   Hart Carwin MD   Signed by:   Ezra Sites RN on 04/11/2010   Method used:   Electronically to        [facility]* (retail)       [address]       [place], [place]  [address]       Ph: [phone]       Fax: [phone]   RxID:   [id number] DULCOLAX 5 MG  TBEC (BISACODYL) Day before procedure take 2 at 3pm and 2 at 8pm.  #4 x 0   Entered by:   Ezra Sites RN   Authorized by:   Hart Carwin MD   Signed by:   Ezra Sites RN on 04/11/2010   Method used:    Electronically to        [facility]* (retail)       [address]       [place], [place]  [address]       Ph: [phone]       Fax: [phone]   RxID:   [id number] MIRALAX   POWD (POLYETHYLENE GLYCOL 3350) As per prep  instructions.  #255gm x 0   Entered by:   Ezra Sites RN   Authorized by:   Hart Carwin MD   Signed by:   Ezra Sites RN on 04/11/2010   Method used:   Electronically to        [facility]* (retail)       [address]       [place], [place]  [address]       Ph: [phone]       Fax: [phone]   RxID:   [id number]

## 2010-04-25 ENCOUNTER — Other Ambulatory Visit: Payer: Self-pay | Admitting: Internal Medicine

## 2010-04-25 ENCOUNTER — Other Ambulatory Visit (AMBULATORY_SURGERY_CENTER): Payer: 59 | Admitting: Internal Medicine

## 2010-04-25 DIAGNOSIS — D126 Benign neoplasm of colon, unspecified: Secondary | ICD-10-CM

## 2010-04-25 DIAGNOSIS — K621 Rectal polyp: Secondary | ICD-10-CM

## 2010-04-25 DIAGNOSIS — K5730 Diverticulosis of large intestine without perforation or abscess without bleeding: Secondary | ICD-10-CM

## 2010-04-25 DIAGNOSIS — Z86010 Personal history of colonic polyps: Secondary | ICD-10-CM

## 2010-04-25 DIAGNOSIS — Z1211 Encounter for screening for malignant neoplasm of colon: Secondary | ICD-10-CM

## 2010-04-29 DIAGNOSIS — D126 Benign neoplasm of colon, unspecified: Secondary | ICD-10-CM

## 2010-04-29 HISTORY — DX: Benign neoplasm of colon, unspecified: D12.6

## 2010-04-30 ENCOUNTER — Encounter: Payer: Self-pay | Admitting: Internal Medicine

## 2010-04-30 NOTE — Procedures (Addendum)
Summary: Colonoscopy  Patient: Sergio [name] Note: All result statuses are Final unless otherwise noted.  Tests: (1) Colonoscopy (COL)   COL Colonoscopy           DONE     [facility]     [address]     [place], [place]  [address]           COLONOSCOPY PROCEDURE REPORT           PATIENT:  Sergio [name], Sergio [name]  MR#:  [id number]     BIRTHDATE:  01-04-1941, 69 yrs. old  GENDER:  male     ENDOSCOPIST:  Hedwig Morton. Juanda Chance, MD     REF. BY:  Laurita Quint, M.D.     PROCEDURE DATE:  04/25/2010     PROCEDURE:  Colonoscopy 19147     ASA CLASS:  Class II     INDICATIONS:  history of pre-cancerous (adenomatous) colon polyps,     evaluation of ulcerative colitis adenom. polyp 2007,     uncle with colon cancer     MEDICATIONS:   Versed 8 mg, Fentanyl 75 mcg           DESCRIPTION OF PROCEDURE:   After the risks benefits and     alternatives of the procedure were thoroughly explained, informed     consent was obtained.  Digital rectal exam was performed and     revealed no rectal masses.   The LB PCF-H180AL [id number] endoscope     was introduced through the anus and advanced to the cecum, which     was identified by both the appendix and ileocecal valve, without     limitations.  The quality of the prep was good, using MiraLax.     The instrument was then slowly withdrawn as the colon was fully     examined.     <<PROCEDUREIMAGES>>           FINDINGS:  Four polyps were found. four 2-3 mm polyps rectum to     30cm The polyps were removed using cold biopsy forceps (see     image11, image10, and image9).  Mild diverticulosis was found (see     image1 and image8).  This was otherwise a normal examination of     the colon (see image10, image5, image4, image7, and image3).     Retroflexed views in the rectum revealed no abnormalities.    The     scope was then withdrawn from the patient and the procedure     completed.           COMPLICATIONS:  None     ENDOSCOPIC IMPRESSION:     1) Four  polyps     2) Mild diverticulosis     3) Otherwise normal examination     RECOMMENDATIONS:     1) Await pathology results     2) High fiber diet.     REPEAT EXAM:  In 5 - 7 year(s) for.  recall will depend on polyp     pathology           ______________________________     Hedwig Morton. Juanda Chance, MD           CC:           n.     eSIGNED:   Hedwig Morton. Alizey Noren at 04/25/2010 12:12 PM           Iverson, Sees, [phone]  Note: An exclamation mark (!) indicates a result that  was not dispersed into the flowsheet. Document Creation Date: 04/25/2010 12:12 PM _______________________________________________________________________  (1) Order result status: Final Collection or observation date-time: 04/25/2010 11:57 Requested date-time:  Receipt date-time:  Reported date-time:  Referring Physician:   Ordering Physician: [name] [phone]) Specimen Source:  Source: Launa Grill Order Number: [id number] Lab site:   Appended Document: Colonoscopy     Procedures Next Due Date:    Colonoscopy: 05/2015  Appended Document: Colonoscopy     Clinical Lists Changes  Observations: Added new observation of PAST SURG HX: MEDIASTINOSCOPY COLONOSCOPY SCREENING --NORMAL :(DR. Maryruth Bun) :(1991) PALATOPLASTY, TONSILS AND ADENOIDS SEPTOPLASTYDUE SLEEP APNEA CIRCUMCISION (DR WOLFE) 09/1999 LEFT KNEE ARTHRASCOPY (DR. ROWAN):04/2003) MRI BRAIN NORMAL;;--NEGATIVE ACOUSTIC NEUROMA:(08/02/2003) EGD :(03/10/2005) COLONOSCOPY ; POLYP X2 BIOPSY NEGATIVE; DIVERTICULOSIS:(03/10/2005) RIGHT KNEE ARTHROSCOPY:(DR. ROWAN) :(12/2004) UMBIICAL HERNIA REPAIR :(DR. INGRAM):(10/18/2005) CT OF ABDOMEN AND PELVIS SIGMOID DIVERTICULOSIS:(10/18/2005) Colonoscopy 5 Polyps Diverticulosis (Dr Juanda Chance) (04/25/2010)                         5 yrs (05/03/2010 10:30) Added new observation of ZOSTAVAX: Zostavax (02/20/2009 10:33)       Past Surgical History:    MEDIASTINOSCOPY    COLONOSCOPY SCREENING --NORMAL :(DR. Maryruth Bun)  :(1991)    PALATOPLASTY, TONSILS AND ADENOIDS SEPTOPLASTYDUE SLEEP APNEA    CIRCUMCISION (DR WOLFE) 09/1999    LEFT KNEE ARTHRASCOPY (DR. ROWAN):04/2003)    MRI BRAIN NORMAL;;--NEGATIVE ACOUSTIC NEUROMA:(08/02/2003)    EGD :(03/10/2005)    COLONOSCOPY ; POLYP X2 BIOPSY NEGATIVE; DIVERTICULOSIS:(03/10/2005)    RIGHT KNEE ARTHROSCOPY:(DR. ROWAN) :(12/2004)    UMBIICAL HERNIA REPAIR :(DR. INGRAM):(10/18/2005)    CT OF ABDOMEN AND PELVIS SIGMOID DIVERTICULOSIS:(10/18/2005)    Colonoscopy 5 Polyps Diverticulosis (Dr Juanda Chance) (04/25/2010)                         5 yrs    Other Immunization History:    Zostavax # 1:  Zostavax (02/20/2009)

## 2010-05-09 NOTE — Letter (Signed)
Summary: Patient Notice- Polyp Results  [facility]  [address] [place], [place] [address]   Phone: [phone]  Fax: [phone]        April 30, 2010 MRN: [id number]    Sergio [name] [address] [place], [place]  [address]    Dear [name],  I am pleased to inform you that the colon polyp(s) removed during your recent colonoscopy was (were) found to be benign (no cancer detected) upon pathologic examination.T.he polyp was adenomatous ( precancerous)  I recommend you have a repeat colonoscopy examination in _5  years to look for recurrent polyps, as having colon polyps increases your risk for having recurrent polyps or even colon cancer in the future.  Should you develop new or worsening symptoms of abdominal pain, bowel habit changes or bleeding from the rectum or bowels, please schedule an evaluation with either your primary care physician or with me.  Additional information/recommendations:  __x No further action with gastroenterology is needed at this time. Please      follow-up with your primary care physician for your other healthcare      needs.  __ Please call [phone] to schedule a return visit to review your      situation.  __ Please keep your follow-up visit as already scheduled.  __ Continue treatment plan as outlined the day of your exam.  Please call us if you are having persistent problems or have questions about your condition that have not been fully answered at this time.  Sincerely,  Hart Carwin MD  This letter has been electronically signed by your physician.  Appended Document: Patient Notice- Polyp Results letter Sergio [name]

## 2010-05-22 ENCOUNTER — Ambulatory Visit (INDEPENDENT_AMBULATORY_CARE_PROVIDER_SITE_OTHER): Payer: 59 | Admitting: Family Medicine

## 2010-05-22 ENCOUNTER — Encounter: Payer: Self-pay | Admitting: Family Medicine

## 2010-05-22 VITALS — BP 122/56 | HR 55 | Temp 97.8°F | Ht 73.0 in | Wt 258.1 lb

## 2010-05-22 DIAGNOSIS — E119 Type 2 diabetes mellitus without complications: Secondary | ICD-10-CM

## 2010-05-22 NOTE — Assessment & Plan Note (Signed)
Discussed at length.... diet, exercise, anfd monitoring technique. He will check sugars once a day in a 4 day progression style for one month and then return to analyze and discuss.

## 2010-05-22 NOTE — Progress Notes (Signed)
  Subjective:    Patient ID: Sergio [name], male    DOB: 30-Sep-1940, 70 y.o.   MRN: [id number]  HPI Pt here for followup of Diabetes. He brought his diary with him. Generally his nos are not bad. He feels well and has no complaints. He had a "reward" ( a very sugary morsel" which he knew he shouldn't and had an episode of palpitations subsequent to the intake. His neck popped in the gym a few months ago but it is getting better.    Review of Systems Noncontributory except as above.      Objective:   Physical Exam WDWN WM NAD. HEENT wnl Heart RRR w/o m Lungs CTA Abd benign Feet bilat monofilament absent, skin intact, no cuts, blisters or bruises.        Assessment & Plan:

## 2010-06-14 ENCOUNTER — Other Ambulatory Visit: Payer: 59

## 2010-07-01 ENCOUNTER — Other Ambulatory Visit: Payer: Self-pay | Admitting: Family Medicine

## 2010-07-01 DIAGNOSIS — N400 Enlarged prostate without lower urinary tract symptoms: Secondary | ICD-10-CM

## 2010-07-01 DIAGNOSIS — E119 Type 2 diabetes mellitus without complications: Secondary | ICD-10-CM

## 2010-07-01 DIAGNOSIS — K5730 Diverticulosis of large intestine without perforation or abscess without bleeding: Secondary | ICD-10-CM

## 2010-07-01 DIAGNOSIS — E785 Hyperlipidemia, unspecified: Secondary | ICD-10-CM

## 2010-07-01 NOTE — Progress Notes (Signed)
Addended byMills Koller on: 07/01/2010 11:19 AM   Modules accepted: Orders

## 2010-07-04 ENCOUNTER — Other Ambulatory Visit (INDEPENDENT_AMBULATORY_CARE_PROVIDER_SITE_OTHER): Payer: 59 | Admitting: Family Medicine

## 2010-07-04 DIAGNOSIS — K5730 Diverticulosis of large intestine without perforation or abscess without bleeding: Secondary | ICD-10-CM

## 2010-07-04 DIAGNOSIS — N400 Enlarged prostate without lower urinary tract symptoms: Secondary | ICD-10-CM

## 2010-07-04 DIAGNOSIS — E785 Hyperlipidemia, unspecified: Secondary | ICD-10-CM

## 2010-07-04 DIAGNOSIS — E119 Type 2 diabetes mellitus without complications: Secondary | ICD-10-CM

## 2010-07-04 LAB — MICROALBUMIN / CREATININE URINE RATIO
Creatinine,U: 182.4 mg/dL
Microalb Creat Ratio: 0.2 mg/g (ref 0.0–30.0)

## 2010-07-04 LAB — LIPID PANEL
HDL: 43 mg/dL (ref >39.00)
LDL Cholesterol: 117 mg/dL — ABNORMAL HIGH (ref 0–99)
Total CHOL/HDL Ratio: 4
Triglycerides: 101 mg/dL (ref 0.0–149.0)
VLDL: 20.2 mg/dL (ref 0.0–40.0)

## 2010-07-04 LAB — HEPATIC FUNCTION PANEL
Albumin: 3.7 g/dL (ref 3.5–5.2)
Total Bilirubin: 0.6 mg/dL (ref 0.3–1.2)

## 2010-07-04 LAB — CBC WITH DIFFERENTIAL/PLATELET
Basophils Absolute: 0 10*3/uL (ref 0.0–0.1)
Eosinophils Absolute: 0.1 10*3/uL (ref 0.0–0.7)
Lymphocytes Relative: 33.9 % (ref 12.0–46.0)
Lymphs Abs: 1.5 10*3/uL (ref 0.7–4.0)
Monocytes Relative: 10.8 % (ref 3.0–12.0)
Platelets: 149 10*3/uL — ABNORMAL LOW (ref 150.0–400.0)
RDW: 13.2 % (ref 11.5–14.6)

## 2010-07-04 LAB — TSH: TSH: 2.13 u[IU]/mL (ref 0.35–5.50)

## 2010-07-04 LAB — HEMOGLOBIN A1C: Hgb A1c MFr Bld: 6 % (ref 4.6–6.5)

## 2010-07-04 LAB — VITAMIN B12: Vitamin B-12: 590 pg/mL (ref 211–911)

## 2010-07-18 ENCOUNTER — Encounter: Payer: Self-pay | Admitting: Family Medicine

## 2010-07-18 ENCOUNTER — Ambulatory Visit (INDEPENDENT_AMBULATORY_CARE_PROVIDER_SITE_OTHER): Payer: 59 | Admitting: Family Medicine

## 2010-07-18 ENCOUNTER — Ambulatory Visit: Payer: 59 | Admitting: Family Medicine

## 2010-07-18 DIAGNOSIS — K219 Gastro-esophageal reflux disease without esophagitis: Secondary | ICD-10-CM

## 2010-07-18 DIAGNOSIS — E119 Type 2 diabetes mellitus without complications: Secondary | ICD-10-CM

## 2010-07-18 DIAGNOSIS — E785 Hyperlipidemia, unspecified: Secondary | ICD-10-CM

## 2010-07-18 DIAGNOSIS — M7020 Olecranon bursitis, unspecified elbow: Secondary | ICD-10-CM

## 2010-07-18 DIAGNOSIS — K5730 Diverticulosis of large intestine without perforation or abscess without bleeding: Secondary | ICD-10-CM

## 2010-07-18 NOTE — Assessment & Plan Note (Signed)
Stable

## 2010-07-18 NOTE — Patient Instructions (Signed)
RTC 6 mos, A1C prior. 

## 2010-07-18 NOTE — Progress Notes (Signed)
  Subjective:    Patient ID: Sergio [name], male    DOB: May 01, 1940, 70 y.o.   MRN: [id number]  HPI Pt here for Comp Exam. He recently had cataract surgery and eyesight improved. He has no complaints and feels well. He is surprised at the weight not being lower than last time.   Review of Systems  Constitutional: Negative for fever, chills, diaphoresis, appetite change, fatigue and unexpected weight change.  HENT: Negative for hearing loss, ear pain, tinnitus and ear discharge.   Eyes: Negative for pain, discharge and visual disturbance.  Respiratory: Negative for cough, shortness of breath and wheezing.   Cardiovascular: Negative for chest pain and palpitations.       No SOB w/ exertion  Gastrointestinal: Negative for nausea, vomiting, abdominal pain, diarrhea, constipation and blood in stool.       No heartburn or swallowing problems.  Genitourinary: Negative for dysuria, frequency and difficulty urinating.       Some  Nocturia slightly increased occas.  Musculoskeletal: Negative for myalgias, back pain and arthralgias.  Skin: Negative for rash.       No itching or dryness.  Neurological: Negative for tremors and numbness.       No tingling or balance problems.  Hematological: Negative for adenopathy. Does not bruise/bleed easily.  Psychiatric/Behavioral: Negative for dysphoric mood and agitation.       Objective:   Physical Exam  Constitutional: He is oriented to person, place, and time. He appears well-developed and well-nourished. No distress.  HENT:  Head: Normocephalic and atraumatic.  Right Ear: External ear normal.  Left Ear: External ear normal.  Nose: Nose normal.  Mouth/Throat: Oropharynx is clear and moist.  Eyes: Conjunctivae and EOM are normal. Pupils are equal, round, and reactive to light. Right eye exhibits no discharge. Left eye exhibits no discharge. No scleral icterus.  Neck: Normal range of motion. Neck supple. No thyromegaly present.  Cardiovascular:  Normal rate, regular rhythm, normal heart sounds and intact distal pulses.   No murmur heard. Pulmonary/Chest: Effort normal and breath sounds normal. No respiratory distress. He has no wheezes.  Abdominal: Soft. Bowel sounds are normal. He exhibits no distension and no mass. There is no tenderness. There is no rebound and no guarding.  Genitourinary: Rectum normal, prostate normal and penis normal. Guaiac negative stool.  Musculoskeletal: Normal range of motion. He exhibits no edema.  Lymphadenopathy:    He has no cervical adenopathy.  Neurological: He is alert and oriented to person, place, and time. Coordination normal.       Monofilament nml  Skin: Skin is warm and dry. No rash noted. He is not diaphoretic.  Psychiatric: He has a normal mood and affect. His behavior is normal. Judgment and thought content normal.          Assessment & Plan:  HMPE  I have personally reviewed the Medicare Annual Wellness questionnaire and have noted 1. The patient's medical and social history 2. Their use of alcohol, tobacco or illicit drugs 3. Their current medications and supplements 4. The patient's functional ability including ADL's, fall risks, home safety risks and hearing or visual             impairment. 5. Diet and physical activities 6. Evidence for depression or mood disorders

## 2010-07-18 NOTE — Assessment & Plan Note (Addendum)
Great control. Cont curr lifestyle. Lab Results  Component Value Date   HGBA1C 6.0 07/04/2010

## 2010-07-18 NOTE — Assessment & Plan Note (Signed)
Good control. Watch diet to get LDL lower. Lab Results  Component Value Date   CHOL 180 07/04/2010   CHOL 181 05/14/2009   CHOL 196 05/09/2008   Lab Results  Component Value Date   HDL 43.00 07/04/2010   HDL 50.30 05/14/2009   HDL 60.4 05/09/2008   Lab Results  Component Value Date   LDLCALC 117* 07/04/2010   LDLCALC 100* 05/14/2009   LDLCALC 133* 05/09/2008   Lab Results  Component Value Date   TRIG 101.0 07/04/2010   TRIG 152.0* 05/14/2009   TRIG 112 05/09/2008   Lab Results  Component Value Date   CHOLHDL 4 07/04/2010   CHOLHDL 4 05/14/2009   CHOLHDL 4.9 CALC 05/09/2008   No results found for this basename: LDLDIRECT

## 2010-07-18 NOTE — Assessment & Plan Note (Signed)
Come in for LLQ pain longer than two days.

## 2010-07-18 NOTE — Assessment & Plan Note (Signed)
Resolved

## 2010-07-19 NOTE — Op Note (Signed)
NAMEDUTCH, ING               ACCOUNT NO.:  [id number]   MEDICAL RECORD NO.:  [id number]          PATIENT TYPE:  AMB   LOCATION:  DSC                          FACILITY:  [facility]   PHYSICIAN:  Feliberto Gottron. Turner Daniels, M.D.   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   PROCEDURE:  Right shoulder arthroscopic anterior inferior acromioplasty,  distal clavicle spur excision, debridement of massive supraspinatus rotator  cuff tear.   SURGEON:  Feliberto Gottron. Turner Daniels, M.D.   FIRST ASSISTANT:  Skip Mayer, PA-C.   ANESTHETIC:  Interscalene block with endotracheal.   ESTIMATED BLOOD LOSS:  Minimal.   FLUID REPLACEMENT:  800 mL crystalloid.   DRAINS PLACED:  None.   TOURNIQUET TIME:  None.   INDICATIONS FOR PROCEDURE:  70 year old gentleman followed for a number of  years with right shoulder impingement syndrome. No gross motor weakness, but  fairly classic impingement signs. He has failed conservative treatment with  anti-inflammatory medicines, physical therapy, has declined a cortisone  injection and desires elective evaluation of his shoulder with removal of a  1 cm type 2 subacromial spur, subclavicular spur, and evaluation of the  rotator cuff.  In his age group, if he has a  significant tear, we will  probably simply debride it. Risks and benefits of surgery were well  understood by the patient.   DESCRIPTION OF PROCEDURE:  The patient identified by armband, taken to the  operating room at [facility] day surgery center where appropriate site monitors  were attached.  A right  shoulder interscalene block anesthesia induced with  the patient in supine position. He was then placed in the full beach chair  position and the right upper extremity prepped and draped in usual sterile  fashion from the wrist to the hemithorax. Using a #11 blade, we then made  standard portals 1.5 cm anterior to the [facility] joint lateral to the junction of  middle and posterior thirds acromion, posterior to the posterolateral corner  of the acromion process. The inflow was placed anteriorly. The arthroscope  laterally and a 4.2 great white sucker shaver posteriorly allowing  evaluation of the subacromial bursal space and some inflamed bursa was  removed. We immediately identified a large tear of the supraspinatus all the  way back to the glenoid rim which was debrided back to a stable margin. The  infraspinatus was intact. Visualizing the glenohumeral joint through the  cuff tear, some labral tearing was identified and debrided the subscapularis  was intact. The infraspinatus was intact. The biceps and biceps anchor were  intact and the articular cartilage of the glenoid and humerus was in good  condition as well. Directing our attention back to the subacromial space, we  outlined the  subacromial spur and removed it with two full thickness passes  of the 4.5 hooded vortex bur. This revealed the subclavicular spur which was  likewise removed with the vortex bur. The shoulder was then irrigated out  with normal saline solution. The arthroscopic instruments were then removed  and a dressing of Xeroform, 4 x 4 dressing sponges, paper tape and a sling  applied. The patient was awakened and taken to the recovery room without  difficulty.       FJR/MEDQ  D:  08/28/2004  T:  08/28/2004  Job:  [id number]

## 2010-07-19 NOTE — Op Note (Signed)
Sergio [name], Sergio [name]               ACCOUNT NO.:  [id number]   MEDICAL RECORD NO.:  [id number]          PATIENT TYPE:  AMB   LOCATION:  DSC                          FACILITY:  [facility]   PHYSICIAN:  Alfonse Ras, MD   DATE OF BIRTH:  [birth year]   DATE OF PROCEDURE:  03/14/2005  DATE OF DISCHARGE:                                 OPERATIVE REPORT   PREOPERATIVE DIAGNOSIS:  Umbilical hernia.   POSTOPERATIVE DIAGNOSIS:  Umbilical hernia.   PROCEDURE:  Umbilical hernia repair.   SURGEON:  Alfonse Ras, MD.   ANESTHESIA:  General.   DESCRIPTION OF PROCEDURE:  The patient was taken to the operating room and  placed in a supine position.  After adequate general anesthesia was induced  using a laryngeal mask, the abdomen was prepped and draped in the normal  sterile fashion.  Using a transverse supraumbilical incision, I dissected  down onto the hernia sac.  It was freed up on all of its edges down to the  fascial defect.  The fascial defect measured only about 1 to 1.5 cm and was  closed with interrupted figure-of-eight 0 Surgilon sutures.  Satisfied with  the repair, I did not feel it required mesh because of the very small nature  of the defect.  The skin was then closed with a subcuticular 3-0 Monocryl,  and the tissues were injected with 0.5% Marcaine.  Steri-Strips and a  sterile dressing were applied.  The patient tolerated the procedure well and  went to PACU in good condition.      Alfonse Ras, MD  Electronically Signed     KRE/MEDQ  D:  03/14/2005  T:  03/15/2005  Job:  [id number]

## 2010-07-19 NOTE — Assessment & Plan Note (Signed)
[facility]                                   ON-CALL NOTE   NAME:Sergio [name], Sergio [name]                      MRN:          [id number]  DATE:10/18/2005                            DOB:          1940-05-11    REFERRING PHYSICIAN:  Sonda Primes, MD   Patient of Arta Silence, MD   The patient called because he woke up at 2 a.m. with severe abdominal pain.  He could not sleep.  He describes it as midepigastric pain and it is severe.  He called the nurse help line with [facility].  They sent him to the  urgent care across from [facility], which is closed.  He therefore got  back in his car and drove back to his house in [place].  He is now  calling asking what to do.  He is in severe pain.   PLAN:  Referred patient to [facility] emergency room for evaluation stat.                                   Jeffrey A. Tawanna Cooler, MD   JAT/MedQ  DD:  10/18/2005  DT:  10/18/2005  Job #:  [id number]   cc:   Arta Silence, MD

## 2010-07-19 NOTE — Assessment & Plan Note (Signed)
 HEALTHCARE                           GASTROENTEROLOGY OFFICE NOTE   NAME:[name], [name]                      MRN:          [id number]  DATE:12/08/2005                            DOB:          02/26/1941    Sergio [name] is a very nice 70 year old gentleman who was recently seen at  [facility] emergency room with diverticulitis.  Records from the emergency room  from October 18, 2005 indicated that the CT scan of the abdomen was  indicative of diverticulitis.  He woke up in the morning with severe  abdominal pain.  The patient was not admitted, but he was put on bowel rest  and a combination of Cipro and Flagyl which he completed.  He is currently  asymptomatic and has been so for about a month.  His bowel habits are  regular.  There is no rectal bleeding or abdominal pain.  His first and most  recent colonoscopy was done in January of this year, and he was found to  have an adenomatous polyp of the colon.  He also had upper endoscopy which  was normal.   PHYSICAL EXAMINATION:  VITAL SIGNS:  Blood pressure 174/78, pulse 60, weight  257 pounds which represents a 20 pound weight loss which was intentional.  GENERAL:  The patient was alert and oriented and in no distress.  LUNGS:  Clear to auscultation.  CARDIAC:  Normal S1 and S2.  ABDOMEN:  Soft, nontender, with normoactive bowel sounds.  Liver edge at  costal margin.  No tenderness.  RECTAL:  Not done.  EXTREMITIES:  No edema.   IMPRESSION:  A 70 year old gentleman status post his first attack of  diverticulitis.  He is currently asymptomatic.   PLAN:  1. High fiber diet given to the patient with instructions.  2. Booklet on diverticulosis and diverticulitis.  3. Recall colonoscopy January of 2004 to followup on colon polyps.  4. He is to follow up with Dr. Hetty [name] for his routine medical care.       Sergio [name]. Sergio Chance, MD    DMB/MedQ  DD:  12/08/2005  DT:  12/09/2005  Job #:  [id number]   cc:    Sergio Silence, MD

## 2010-07-19 NOTE — Op Note (Signed)
NAMEESTUS, KRAKOWSKI                         ACCOUNT NO.:  [id number]   MEDICAL RECORD NO.:  [id number]                   PATIENT TYPE:  AMB   LOCATION:  DSC                                  FACILITY:  [facility]   PHYSICIAN:  Feliberto Gottron. Turner Daniels, M.D.                DATE OF BIRTH:  Oct 21, 1940   DATE OF PROCEDURE:  03/06/2003  DATE OF DISCHARGE:                                 OPERATIVE REPORT   PREOPERATIVE DIAGNOSIS:  Left knee medial meniscal tear.   POSTOPERATIVE DIAGNOSIS:  Left knee medial meniscal tear.   PROCEDURE:  Left knee partial arthroscopic medial meniscectomy.   SURGEON:  Feliberto Gottron. Turner Daniels, M.D.   ASSISTANT:  None.   ANESTHESIA:  General LMA.   ESTIMATED BLOOD LOSS:  Minimal.   FLUIDS REPLACED:  1 liter of crystalloid.   DRAINS:  None.   TOURNIQUET TIME:  None.   INDICATIONS FOR PROCEDURE:  A 71 year old man followed for a presumed medial  meniscal tear of his left knee with continued symptoms despite rest,  exercise, anti-inflammatory medicine, and a fairly classic pattern of medial  meniscal tear demonstrated.  Because of persistent pain, he was taken for  arthroscopic evaluation and treatment of his left knee and has little if any  arthritic changes on plain x-ray.   DESCRIPTION OF PROCEDURE:  The patient identified by armband, taken to the  operating room at [facility].  Appropriate anesthetic monitors  were attached and general LMA anesthesia induced with the patient in the  supine position.  Lateral posts applied to the table and the left lower  extremity prepped and draped in the usual sterile fashion from the ankle to  the mid thigh.  Using a #11 blade, standard inferomedial and inferolateral  peripatellar portals were then made allowing introduction of the arthroscope  through the inferolateral portal and the outflow through the inferomedial  portal.  Diagnostic arthroscopy revealed the patella and trochlea as well as  the medial femoral  condyle, medial tibial condyle, cruciate ligaments, and  lateral compartment were in good condition.  The medial meniscus had a  complex parrot tear at the posterior medial corner which was debrided back  to a stable margin with straight biters as well as a 3.5 Gator sucker shaver  and then thoroughly probed.  The gutters were cleared.  The scope was taken  to the lateral side of the PCL clearing the posterior compartment.  The knee  was then  irrigated with normal saline solution arthroscopically and the arthroscopic  instruments removed.  A dressing of Xeroform, 4x4 dressing, sponges, Webril,  and an Ace wrap applied.  The patient was then awakened and taken to the  recovery room without difficulty.  Feliberto Gottron. Turner Daniels, M.D.    Ovid Curd  D:  03/06/2003  T:  03/06/2003  Job:  [id number]

## 2010-07-19 NOTE — Letter (Signed)
January 08, 2006    Delon Sacramento, M.D.  [address], [address]  [place], [place] [address]   RE:  Sergio [name], Sergio [name]  MRN:  [id number]  /  DOB:  [birth year]   Dear Dr. Jamiere Picker:   This is in regard to [employer], a 70 year old white male whom you are  scheduled to do cataract surgery upon.  I did a pre-op physical on him today  and feel he is stable for surgery.  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  He had questions about the fingernail of his left index  finger that he had trauma to.  It appears that the nail is now growing out  and should repair easily.  He wondered if he needed a stress test.  The  patient works out routinely on a treadmill, gets his heart rate up to at  least 135 and has no problems, and I feel he is adequately screened at this  point.  He also discussed dizziness with getting up quickly.  I feel he  probably has some carotid insufficiency.  I do not feel he needs a carotid  ultrasound at this point, and he has some mild difficulty with deep  breathing while working out at times, which I think is not unusual.  He was  seen in the emergency room in August for diverticulitis and was prescribed  Flagyl and Cipro, with which he did well, and was then seen by Dr. Juanda Chance a  week later.  He had a colonoscopy January of 2007 that showed 2 polyps.  Biopsy was benign.  It also showed diverticula.  He uses Aleve twice a day  for aches and pains without difficulty, and I suggested he substitute  Tylenol.  Otherwise, he has no real acute complaints today.   MEDICINES:  1. Flomax 0.4 mg a day.  2. Enteric-coated 81 mg aspirin.  3. Zantac 150 mg which he has stopped now in favor of Prilosec OTC which      he takes routinely.  4. Lipitor 20 mg at night.   SURGICAL:  He has had an umbilical hernia repair in approximately October of  2006 by Dr. Derrell Lolling and he had right knee  arthroscopy about the same time by  Dr. Turner Daniels.   He has no known drug allergies.   Quit smoking cigars approximately 2 years ago.  Drinks occasionally.  Does  not use drugs.   REVIEW OF SYSTEMS:  Significant for dizziness with standing from sitting.  Wears glasses since 1966.  His last exam was by you.  Hearing was done  formally in 1999 and was normal.  He has TMJ and sees a dentist regularly.  Has not had treatment for it.  His last dental exam was in May of this year.  He had a UVPP for sleep apnea and has done well since that time.  He has  hemorrhoids for which he takes high fiber diet and has not had episodes of  inflammation for some time now.  He has occasional reflux, reasonably  controlled with his Prilosec.  He has benign prostatic hypertrophy followed  by Dr. Sheppard Penton.  Has stopped his Flomax and is doing reasonably well.  Otherwise, head, eyes, ears, nose, and throat, teeth and gums, cardiac,  respiratory, gastrointestinal, and genitourinary systems are  noncontributory.   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   FAMILY HISTORY:  Father died at the age of 7 with diabetes, partial  amputation of the left foot.  Mother died at the age of 32 with diabetes,  cancer of the liver, breast cancer 6 years previous to that.  Three brothers  and 2 sisters all alive and well, 1 sister has multiple sclerosis.   LAST TETANUS:  In 2001.   PHYSICAL EXAM:  This is a well-developed, well-nourished 70 year old white  male in no acute distress.  Temperature of 97.5, pulse of 56, blood pressure 100/60, and weight of 248  at 73 inches with no known drug allergies.  Conjunctivae are clear.  HEENT is within normal limits.  Hearing is normal.  NECK:  Without adenopathy.  Thyroid is without nodularity.  LUNGS:  Clear to auscultation.  BACK:  Straight  and nontender with no CVA tenderness.  HEART:  Regular rate and rhythm without murmur.  Pulses are 2+ throughout.  Carotids are without bruits.  CHEST:  Symmetric with good excursion.  ABDOMEN:  Soft, nontender.  Good bowel sounds.  No masses.  No  lymphadenopathy is noted.  Testicles are descended bilaterally without nodularity.  No hernias or  adenopathy are noted.  RECTAL:  Normal tone.  Guaiac negative stool.  No mass with deflated  external hemorrhoids.  Prostate is 20 g, smooth, soft, symmetric, raphe is  intact without nodularity.  Muscle strength is 5/5 with range of motion, gait, mobility normal.  He does  have swelling of the phalangeal joints of the hands bilaterally in multiple  areas.  He has mild actinic keratoses with benign moles throughout the  thorax.   Laboratory data was discussed.  His metabolic panel was completely normal,  except for his sugar being 117.  Lipid profile showed an HDL of 40.9.  Triglycerides are 63 and his LDL was 117 on Lipitor 20.  Micro-albumin was  normal.  Glomerular filtration rate was normal at 65 ml per minute.  A1C was  5.6, TSH was 1.58, and PSA was 0.41.   ASSESSMENT:  1. Preoperative physical, cleared for surgery by Dr. Pardeep Picker for cataracts.  2. Glucose intolerance that is stable.  3. Benign prostatic hypertrophy that is stable off of Flomax.  4. Diverticulosis with an episode of diverticulitis 2 months ago, which      seems stable at this point.  5. Osteoarthritis of the hands, for which I suggest he use Tylenol as      needed.  6. Elevated cholesterol with good response to Lipitor.  7. Reflux disease stable on Prilosec over-the-counter.   He is to return to see me in a year, sooner if symptoms require.    Sincerely,     ______________________________  Arta Silence, MD    RNS/MedQ  DD: 01/08/2006  DT: 01/08/2006  Job #: [id number]

## 2010-07-19 NOTE — Op Note (Signed)
NAMERONN, SMOLINSKY               ACCOUNT NO.:  [id number]   MEDICAL RECORD NO.:  [id number]          PATIENT TYPE:  AMB   LOCATION:  DSC                          FACILITY:  [facility]   PHYSICIAN:  Alfonse Ras, MD   DATE OF BIRTH:  1940/08/06   DATE OF PROCEDURE:  03/14/2005  DATE OF DISCHARGE:  03/14/2005                                 OPERATIVE REPORT   PREOPERATIVE DIAGNOSIS:  Umbilical hernia.   POSTOPERATIVE DIAGNOSIS:  Umbilical hernia.   PROCEDURE:  Umbilical hernia repair.   SURGEON:  Alfonse Ras, M.D.   ANESTHESIA:  General.   DESCRIPTION OF PROCEDURE:  The patient was taken to the operating room and  placed in the supine position.  After adequate general anesthesia was  induced, the abdomen was prepped and draped in a normal sterile fashion.  A  transverse infraumbilical incision was made dissecting down onto the hernia  sac.  It was mobilized off the fascia and was reduced.  There was a very  small fascial defect that was closed with interrupted #1 Surgilon.  Adequate  hemostasis was insured and the skin was closed with subcuticular 4-0  Monocryl.  Steri-Strips and sterile dressings were applied.  The patient  tolerated the procedure well and went to the PACU in good condition.      Alfonse Ras, MD  Electronically Signed     KRE/MEDQ  D:  04/09/2005  T:  04/09/2005  Job:  [id number]

## 2010-07-22 ENCOUNTER — Telehealth: Payer: Self-pay | Admitting: *Deleted

## 2010-07-22 NOTE — Telephone Encounter (Signed)
Pt is interested in attending diabetic classes at [facility], form is on your desk.

## 2010-07-24 ENCOUNTER — Telehealth: Payer: Self-pay | Admitting: *Deleted

## 2010-07-24 NOTE — Telephone Encounter (Signed)
Signed.

## 2010-07-24 NOTE — Telephone Encounter (Signed)
Form completed by Dr. Hetty Ely and given to [facility] to process the referral

## 2010-07-24 NOTE — Telephone Encounter (Signed)
Opened in error

## 2010-10-01 ENCOUNTER — Other Ambulatory Visit: Payer: Self-pay | Admitting: *Deleted

## 2010-10-01 MED ORDER — PRAVASTATIN SODIUM 80 MG PO TABS: 80.0000 mg | ORAL_TABLET | Freq: Every day | ORAL | Status: DC

## 2010-10-02 ENCOUNTER — Other Ambulatory Visit: Payer: Self-pay | Admitting: *Deleted

## 2010-10-02 MED ORDER — PRAVASTATIN SODIUM 80 MG PO TABS: 80.0000 mg | ORAL_TABLET | Freq: Every day | ORAL | Status: DC

## 2010-10-02 NOTE — Telephone Encounter (Signed)
Re-ordered due to being sent to incorrect pharmacy yesterday.

## 2010-11-29 ENCOUNTER — Encounter: Payer: Self-pay | Admitting: Family Medicine

## 2011-01-08 ENCOUNTER — Other Ambulatory Visit (INDEPENDENT_AMBULATORY_CARE_PROVIDER_SITE_OTHER): Payer: 59

## 2011-01-08 DIAGNOSIS — E119 Type 2 diabetes mellitus without complications: Secondary | ICD-10-CM

## 2011-01-08 LAB — HEMOGLOBIN A1C: Hgb A1c MFr Bld: 6.2 % (ref 4.6–6.5)

## 2011-01-15 ENCOUNTER — Ambulatory Visit (INDEPENDENT_AMBULATORY_CARE_PROVIDER_SITE_OTHER): Payer: Self-pay | Admitting: Family Medicine

## 2011-01-15 ENCOUNTER — Encounter: Payer: Self-pay | Admitting: Family Medicine

## 2011-01-15 DIAGNOSIS — E119 Type 2 diabetes mellitus without complications: Secondary | ICD-10-CM

## 2011-01-15 DIAGNOSIS — E785 Hyperlipidemia, unspecified: Secondary | ICD-10-CM

## 2011-01-15 DIAGNOSIS — K219 Gastro-esophageal reflux disease without esophagitis: Secondary | ICD-10-CM

## 2011-01-15 NOTE — Progress Notes (Signed)
  Subjective:    Patient ID: Sergio [name], male    DOB: Jul 21, 1940, 70 y.o.   MRN: [id number]  HPI Pt here for 6 month followup. He went to diabetes teaching class and continues exercise regularly. He has done well.  2-3 weeks ago he had lower rib line pain in the intercostal area with movement laterally. He had been in his treestand a lot     Review of Systems  Constitutional: Negative for fever, chills, diaphoresis, activity change, appetite change and fatigue.  HENT: Negative for hearing loss, ear pain, congestion, sore throat, rhinorrhea, neck pain, neck stiffness, postnasal drip, sinus pressure, tinnitus and ear discharge.   Eyes: Negative for pain, discharge and visual disturbance.  Respiratory: Negative for cough, shortness of breath and wheezing.   Cardiovascular: Negative for chest pain and palpitations.       No SOB w/ exertion  Gastrointestinal:       No heartburn or swallowing problems.  Genitourinary:       No nocturia  Skin:       No itching or dryness.  Neurological:       No tingling or balance problems.  All other systems reviewed and are negative.       Objective:   Physical Exam  Constitutional: He appears well-developed and well-nourished. No distress.  HENT:  Head: Normocephalic and atraumatic.  Right Ear: External ear normal.  Left Ear: External ear normal.  Nose: Nose normal.  Mouth/Throat: Oropharynx is clear and moist.  Eyes: Conjunctivae and EOM are normal. Pupils are equal, round, and reactive to light. Right eye exhibits no discharge. Left eye exhibits no discharge.  Neck: Normal range of motion. Neck supple.  Cardiovascular: Normal rate and regular rhythm.   Pulmonary/Chest: Effort normal and breath sounds normal. He has no wheezes.  Lymphadenopathy:    He has no cervical adenopathy.  Skin: He is not diaphoretic.          Assessment & Plan:

## 2011-01-15 NOTE — Assessment & Plan Note (Signed)
Helped with diet and weight loss. Cont.

## 2011-01-15 NOTE — Assessment & Plan Note (Signed)
Will always be on statin due to DM but expect nos better.

## 2011-01-15 NOTE — Patient Instructions (Signed)
RTC 6 mos for Comp Exam with Dr Duncan, labs prior 

## 2011-01-15 NOTE — Assessment & Plan Note (Signed)
Great control. Cont current lifestyle.  Lab Results  Component Value Date   HGBA1C 6.2 01/08/2011

## 2011-04-04 ENCOUNTER — Other Ambulatory Visit: Payer: Self-pay | Admitting: *Deleted

## 2011-04-04 MED ORDER — PRAVASTATIN SODIUM 80 MG PO TABS: 80.0000 mg | ORAL_TABLET | Freq: Every day | ORAL | Status: DC

## 2011-07-06 ENCOUNTER — Other Ambulatory Visit: Payer: Self-pay | Admitting: Family Medicine

## 2011-07-06 DIAGNOSIS — N400 Enlarged prostate without lower urinary tract symptoms: Secondary | ICD-10-CM

## 2011-07-06 DIAGNOSIS — E119 Type 2 diabetes mellitus without complications: Secondary | ICD-10-CM

## 2011-07-08 ENCOUNTER — Other Ambulatory Visit (INDEPENDENT_AMBULATORY_CARE_PROVIDER_SITE_OTHER): Payer: MEDICARE

## 2011-07-08 DIAGNOSIS — IMO0001 Reserved for inherently not codable concepts without codable children: Secondary | ICD-10-CM

## 2011-07-08 DIAGNOSIS — E119 Type 2 diabetes mellitus without complications: Secondary | ICD-10-CM

## 2011-07-08 DIAGNOSIS — N400 Enlarged prostate without lower urinary tract symptoms: Secondary | ICD-10-CM

## 2011-07-08 LAB — MICROALBUMIN / CREATININE URINE RATIO: Microalb Creat Ratio: 0.6 mg/g (ref 0.0–30.0)

## 2011-07-08 LAB — LIPID PANEL
Cholesterol: 177 mg/dL (ref 0–200)
HDL: 46.9 mg/dL (ref >39.00)
LDL Cholesterol: 115 mg/dL — ABNORMAL HIGH (ref 0–99)
Total CHOL/HDL Ratio: 4
Triglycerides: 77 mg/dL (ref 0.0–149.0)

## 2011-07-08 LAB — HEMOGLOBIN A1C: Hgb A1c MFr Bld: 6.1 % (ref 4.6–6.5)

## 2011-07-08 LAB — COMPREHENSIVE METABOLIC PANEL
ALT: 29 U/L (ref 0–53)
CO2: 23 mEq/L (ref 19–32)
Calcium: 9.1 mg/dL (ref 8.4–10.5)
Chloride: 105 mEq/L (ref 96–112)
Creatinine, Ser: 1.1 mg/dL (ref 0.4–1.5)
GFR: 73.49 mL/min (ref >60.00)
Glucose, Bld: 116 mg/dL — ABNORMAL HIGH (ref 70–99)
Total Bilirubin: 0.9 mg/dL (ref 0.3–1.2)
Total Protein: 6.7 g/dL (ref 6.0–8.3)

## 2011-07-15 ENCOUNTER — Encounter: Payer: Self-pay | Admitting: Family Medicine

## 2011-07-22 ENCOUNTER — Telehealth: Payer: Self-pay

## 2011-07-22 ENCOUNTER — Ambulatory Visit (INDEPENDENT_AMBULATORY_CARE_PROVIDER_SITE_OTHER): Payer: MEDICARE | Admitting: Family Medicine

## 2011-07-22 ENCOUNTER — Encounter: Payer: Self-pay | Admitting: Family Medicine

## 2011-07-22 VITALS — BP 122/76 | HR 64 | Temp 98.5°F | Ht 73.0 in | Wt 247.0 lb

## 2011-07-22 DIAGNOSIS — R739 Hyperglycemia, unspecified: Secondary | ICD-10-CM

## 2011-07-22 DIAGNOSIS — Z0000 Encounter for general adult medical examination without abnormal findings: Secondary | ICD-10-CM

## 2011-07-22 DIAGNOSIS — N400 Enlarged prostate without lower urinary tract symptoms: Secondary | ICD-10-CM

## 2011-07-22 DIAGNOSIS — M1990 Unspecified osteoarthritis, unspecified site: Secondary | ICD-10-CM

## 2011-07-22 DIAGNOSIS — E785 Hyperlipidemia, unspecified: Secondary | ICD-10-CM

## 2011-07-22 DIAGNOSIS — R7309 Other abnormal glucose: Secondary | ICD-10-CM

## 2011-07-22 MED ORDER — CELECOXIB 400 MG PO CAPS: 400.0000 mg | ORAL_CAPSULE | Freq: Every day | ORAL | Status: AC

## 2011-07-22 MED ORDER — PRAVASTATIN SODIUM 80 MG PO TABS: 80.0000 mg | ORAL_TABLET | Freq: Every day | ORAL | Status: DC

## 2011-07-22 NOTE — Patient Instructions (Addendum)
I would get a flu shot each fall.   Take care.  Keep exercising and working on your diet.  Recheck sugar/A1c in 6 months before a visit with Para March.

## 2011-07-22 NOTE — Telephone Encounter (Signed)
Pt called about f/u appt and lab. I scheduled pt 01/16/12 at 8:30 am for fasting lab; and 01/22/12 at 8:15am 6 mth f/u appt with Dr Para March.

## 2011-07-22 NOTE — Progress Notes (Signed)
I have personally reviewed the Medicare Annual Wellness questionnaire and have noted 1. The patient's medical and social history 2. Their use of alcohol, tobacco or illicit drugs 3. Their current medications and supplements 4. The patient's functional ability including ADL's, fall risks, home safety risks and hearing or visual             impairment. 5. Diet and physical activities 6. Evidence for depression or mood disorders  The patients weight, height, BMI have been recorded in the chart and visual acuity is per eye clinic.  I have made referrals, counseling and provided education to the patient based review of the above and I have provided the pt with a written personalized care plan for preventive services.  He has a living will and will check his papers.   See scanned forms.  Routine anticipatory guidance given to patient.  See health maintenance. Flu vaccine done 2012 Shingles 2010 PNA 2011 Tetanus 2010 Colonoscopy 2012 psa 2013 Chol 2013  Hyperglycemia. Diet controlled. A1c at goal.  D/w pt.    Elevated Cholesterol: Using medications without problems:yes Muscle aches: no Diet compliance: "I could lose some weight".  He's working low carb diet.  Exercise:yes  BPH.  Stream is okay.  PSA okay.  Feeling well.  Off flomax.   OA.  Hand.  Bilateral.  Complaint with meds.  No ADE. Doing well. Takes celebrex episodically.   PMH and SH reviewed  Meds, vitals, and allergies reviewed.   ROS: See HPI.  Otherwise negative.    GEN: nad, alert and oriented HEENT: mucous membranes moist NECK: supple w/o LA CV: rrr. PULM: ctab, no inc wob ABD: soft, +bs EXT: no edema SKIN: no acute rash Prostate gland firm and smooth, mild enlargement, but no nodularity, tenderness, mass, asymmetry or induration.

## 2011-07-24 DIAGNOSIS — Z0000 Encounter for general adult medical examination without abnormal findings: Secondary | ICD-10-CM | POA: Insufficient documentation

## 2011-07-24 DIAGNOSIS — M1990 Unspecified osteoarthritis, unspecified site: Secondary | ICD-10-CM | POA: Insufficient documentation

## 2011-07-24 NOTE — Assessment & Plan Note (Signed)
D/wpt about diet, exercise weight.  Recheck later in 2013.

## 2011-07-24 NOTE — Assessment & Plan Note (Signed)
Labs discussed, continue meds, work on diet, weight.

## 2011-07-24 NOTE — Assessment & Plan Note (Signed)
Continue prn celebrex.

## 2011-07-24 NOTE — Assessment & Plan Note (Signed)
Doing well, PSA wnl.

## 2011-07-29 ENCOUNTER — Telehealth: Payer: Self-pay

## 2011-07-29 NOTE — Telephone Encounter (Signed)
On 07/23/11 pt had lab at Dr Jennette Kettle office for future testosterone replacement therapy; was told RBC elevated;advised pt to call Dewayne Hatch at hospital for blood donation. Pt concerned blood too thick. Pt request call back. ( I requested Dr Blair Heys recent lab results faxed to Dr Para March [phone].)

## 2011-07-29 NOTE — Telephone Encounter (Signed)
I called him.  I don't have the labs, but if he is polycythemic then this plain is reasonable.  I'll defer to uro.  Awaiting records.

## 2011-07-30 ENCOUNTER — Encounter: Payer: Self-pay | Admitting: Urology

## 2011-08-02 ENCOUNTER — Encounter: Payer: Self-pay | Admitting: Urology

## 2011-09-01 ENCOUNTER — Encounter: Payer: Self-pay | Admitting: Urology

## 2011-11-18 ENCOUNTER — Encounter: Payer: Self-pay | Admitting: Family Medicine

## 2011-11-18 DIAGNOSIS — H40009 Preglaucoma, unspecified, unspecified eye: Secondary | ICD-10-CM | POA: Insufficient documentation

## 2012-01-16 ENCOUNTER — Other Ambulatory Visit (INDEPENDENT_AMBULATORY_CARE_PROVIDER_SITE_OTHER): Payer: MEDICARE

## 2012-01-16 DIAGNOSIS — R7309 Other abnormal glucose: Secondary | ICD-10-CM

## 2012-01-16 DIAGNOSIS — R739 Hyperglycemia, unspecified: Secondary | ICD-10-CM

## 2012-01-16 LAB — HEMOGLOBIN A1C: Hgb A1c MFr Bld: 6.3 % (ref 4.6–6.5)

## 2012-01-16 LAB — GLUCOSE, RANDOM: Glucose, Bld: 117 mg/dL — ABNORMAL HIGH (ref 70–99)

## 2012-01-22 ENCOUNTER — Ambulatory Visit (INDEPENDENT_AMBULATORY_CARE_PROVIDER_SITE_OTHER): Payer: MEDICARE | Admitting: Family Medicine

## 2012-01-22 ENCOUNTER — Encounter: Payer: Self-pay | Admitting: Family Medicine

## 2012-01-22 VITALS — BP 120/80 | HR 74 | Temp 98.7°F | Wt 253.0 lb

## 2012-01-22 DIAGNOSIS — S46219A Strain of muscle, fascia and tendon of other parts of biceps, unspecified arm, initial encounter: Secondary | ICD-10-CM | POA: Insufficient documentation

## 2012-01-22 DIAGNOSIS — R739 Hyperglycemia, unspecified: Secondary | ICD-10-CM

## 2012-01-22 DIAGNOSIS — S46819A Strain of other muscles, fascia and tendons at shoulder and upper arm level, unspecified arm, initial encounter: Secondary | ICD-10-CM

## 2012-01-22 DIAGNOSIS — S43499A Other sprain of unspecified shoulder joint, initial encounter: Secondary | ICD-10-CM

## 2012-01-22 DIAGNOSIS — R7309 Other abnormal glucose: Secondary | ICD-10-CM

## 2012-01-22 DIAGNOSIS — L609 Nail disorder, unspecified: Secondary | ICD-10-CM | POA: Insufficient documentation

## 2012-01-22 NOTE — Assessment & Plan Note (Signed)
Diet, exercise discussed.  He'll work on it, recheck in 6 months.  Labs discussed.  He agrees.

## 2012-01-22 NOTE — Assessment & Plan Note (Signed)
No intervention needed, reassured.

## 2012-01-22 NOTE — Patient Instructions (Addendum)
Call ortho about your arm/shoulder.  Work on [employer] and get more exercise (walking). Recheck at a physical in 6 months, labs ahead of time.  Take care.

## 2012-01-22 NOTE — Assessment & Plan Note (Signed)
He'll f/u with ortho.

## 2012-01-22 NOTE — Progress Notes (Signed)
Hyperglycemia.  His exercise has been limited recently.  Discussed labs and diet.    R biceps with proximal tear.  More sore in AM now.  Initial tear was ~14 months ago.  Had seen Dr. Turner Daniels with ortho and MRI was done.  Was injected prev by ortho.  He was asking about PT to rehab this.  I asked him to talk to ortho.    R 1st nail with lateral/distal discoloration.  Not painful.  Present for ~6 months.  It isn't clear if it started proximally and has moved distally.    Meds, vitals, and allergies reviewed.   ROS: See HPI.  Otherwise, noncontributory.  GEN: nad, alert and oriented NECK: supple w/o LA CV: rrr.  no murmur EXT: no edema R 1st nail with distal lateral discoloration, unclear if this was distal lesion growing out or fungal lesion moving proximally.   R arm with popeye changes on biceps.

## 2012-04-05 ENCOUNTER — Other Ambulatory Visit: Payer: Self-pay | Admitting: Family Medicine

## 2012-04-05 NOTE — Telephone Encounter (Signed)
Patient requests a 90 day supply to [facility].  ? Quantity and refills.  Please advise.

## 2012-04-05 NOTE — Telephone Encounter (Signed)
Pt needs a 90 day supply refill of his Celebrex 200 mg.  He would like this refilled at [facility].

## 2012-04-06 MED ORDER — CELECOXIB 200 MG PO CAPS: 200.0000 mg | ORAL_CAPSULE | Freq: Two times a day (BID) | ORAL | Status: DC | PRN

## 2012-04-06 NOTE — Telephone Encounter (Signed)
Sent!

## 2012-05-13 ENCOUNTER — Telehealth: Payer: Self-pay | Admitting: *Deleted

## 2012-05-13 ENCOUNTER — Encounter: Payer: Self-pay | Admitting: *Deleted

## 2012-05-13 NOTE — Telephone Encounter (Signed)
Please send a letter with the following: [employer] has mild hyperglycemia, but it isn't high enough to meet the criteria for diabetes.

## 2012-05-13 NOTE — Telephone Encounter (Signed)
Letter faxed.

## 2012-05-13 NOTE — Telephone Encounter (Signed)
Sergio [name] with Dr. Kaimani Picker states that they need a letter stating that Sergio [name] is not diabetic.  He tells them that he is not a diabetic but they need a letter from you stating that.

## 2012-05-27 ENCOUNTER — Encounter: Payer: Self-pay | Admitting: Family Medicine

## 2012-06-03 ENCOUNTER — Encounter: Payer: Self-pay | Admitting: Family Medicine

## 2012-06-03 ENCOUNTER — Telehealth: Payer: Self-pay | Admitting: Family Medicine

## 2012-06-03 ENCOUNTER — Ambulatory Visit (INDEPENDENT_AMBULATORY_CARE_PROVIDER_SITE_OTHER): Payer: MEDICARE | Admitting: Family Medicine

## 2012-06-03 VITALS — BP 118/82 | HR 82 | Temp 99.1°F | Wt 258.8 lb

## 2012-06-03 DIAGNOSIS — R1032 Left lower quadrant pain: Secondary | ICD-10-CM

## 2012-06-03 DIAGNOSIS — R109 Unspecified abdominal pain: Secondary | ICD-10-CM

## 2012-06-03 LAB — POCT URINALYSIS DIPSTICK
Bilirubin, UA: NEGATIVE
Blood, UA: NEGATIVE
Glucose, UA: NEGATIVE
Spec Grav, UA: 1.02
pH, UA: 6

## 2012-06-03 MED ORDER — CIPROFLOXACIN HCL 500 MG PO TABS: 500.0000 mg | ORAL_TABLET | Freq: Two times a day (BID) | ORAL | Status: DC

## 2012-06-03 MED ORDER — METRONIDAZOLE 500 MG PO TABS: 500.0000 mg | ORAL_TABLET | Freq: Three times a day (TID) | ORAL | Status: DC

## 2012-06-03 NOTE — Progress Notes (Signed)
He only takes celebrex prn, a few times a week, other meds are daily.    Sx started yesterday AM.  He would feel faint twinges in his abdomen.  He went out for dinner last night and went to bed.  Woke up at 2AM with abd pain. Since then with intermittent waxing/waning pain, comes in waves.  When he lays back, straight back, the pain is less.  Pain inc with laying on either side.  He has no dysuria but pressing his lower abd can make him have penile pain.  H/o diverticulitis in past.  He had a BM today but the stool today was harder than normal.  No fevers.  He's been more gassy today, more flatus today.  No travel, no exotic foods. He's been eating some nuts recently but this is at baseline.  No blood in stool. No vomiting.  Still has GB and appendix.   Meds, vitals, and allergies reviewed.   ROS: See HPI.  Otherwise, noncontributory.  nad ncat Tm wnl  Nasal and OP exam wnl Neck supple No LA rrr ctab abd soft, not ttp except in LLQ, no rebound, normal BS Not ttp in RLQ or suprapubic area Testes bilaterally descended without nodularity, tenderness or masses. No scrotal masses or lesions. No penis lesions or urethral discharge. Ext well perfused. No edema

## 2012-06-03 NOTE — Patient Instructions (Signed)
Start both antibiotics today.  Don't drink alcohol. I would eat a bland light diet, mainly fluids tonight.   If you get suddenly worse, then go to the ER.  We'll contact you with your lab report. Call back with an update tomorrow AM.

## 2012-06-03 NOTE — Telephone Encounter (Signed)
Patient Information:  Caller Name: [place]  Phone: [phone]  Patient: Sergio [name], Sergio [name]  Gender: Male  DOB: 01/03/1941  Age: 72 Years  PCP: Crawford Givens Clelia Croft) [facility])  Office Follow Up:  Does the office need to follow up with this patient?: No  Instructions For The Office: N/A   Symptoms  Reason For Call & Symptoms: Reports severe abdominal pain in generalized area. Also reports distention and warm to the touch. If patient lays on the left or right side the pain is immediate. Last normal bowel movement 06/03/12.  Reviewed Health History In EMR: Yes  Reviewed Medications In EMR: Yes  Reviewed Allergies In EMR: Yes  Reviewed Surgeries / Procedures: Yes  Date of Onset of Symptoms: 06/02/2012  Treatments Tried: Pepto-bismol tablets  Treatments Tried Worked: No  Guideline(s) Used:  Abdominal Pain - Male  Disposition Per Guideline:   See Today in Office  Reason For Disposition Reached:   Mild pain that comes and goes (cramps) lasts > 24 hours  Advice Given:  N/A  Patient Will Follow Care Advice:  YES  Appointment Scheduled:  06/03/2012 15:30:00 Appointment Scheduled Provider:  Crawford Givens Clelia Croft) [facility])

## 2012-06-03 NOTE — Telephone Encounter (Signed)
Will see today.  

## 2012-06-04 ENCOUNTER — Telehealth: Payer: Self-pay

## 2012-06-04 DIAGNOSIS — R1032 Left lower quadrant pain: Secondary | ICD-10-CM | POA: Insufficient documentation

## 2012-06-04 LAB — CBC WITH DIFFERENTIAL/PLATELET
Basophils Absolute: 0 10*3/uL (ref 0.0–0.1)
Eosinophils Absolute: 0 10*3/uL (ref 0.0–0.7)
Lymphocytes Relative: 8.3 % — ABNORMAL LOW (ref 12.0–46.0)
MCHC: 33.5 g/dL (ref 30.0–36.0)
Monocytes Relative: 9 % (ref 3.0–12.0)
Neutro Abs: 11 10*3/uL — ABNORMAL HIGH (ref 1.4–7.7)
Neutrophils Relative %: 82.1 % — ABNORMAL HIGH (ref 43.0–77.0)
Platelets: 147 10*3/uL — ABNORMAL LOW (ref 150.0–400.0)
RDW: 13.4 % (ref 11.5–14.6)

## 2012-06-04 LAB — COMPREHENSIVE METABOLIC PANEL
ALT: 25 U/L (ref 0–53)
AST: 27 U/L (ref 0–37)
CO2: 28 mEq/L (ref 19–32)
Calcium: 9.1 mg/dL (ref 8.4–10.5)
Chloride: 100 mEq/L (ref 96–112)
Creatinine, Ser: 1.1 mg/dL (ref 0.4–1.5)
GFR: 74.11 mL/min (ref >60.00)
Sodium: 134 mEq/L — ABNORMAL LOW (ref 135–145)
Total Protein: 7.3 g/dL (ref 6.0–8.3)

## 2012-06-04 NOTE — Telephone Encounter (Signed)
Pt seen 06/03/12 and to call back today with update; pt took one cipro and one Flagyl twice after seen yesterday and last night pt slept well with no pain. This morning no abdominal pain unless pt pushes on LLQ of abdomen and then slight pain. Pt wanted to know would medicine work that quickly or could his problem have been something else (not diverticulitis). Pt also wanted to know should pt continue taking med for 10 days since pain has virtually gone away overnight.Please advise. Pt also requested lab results from 06/03/12; advised lab report not back yet.Please advise. Dr Para March is out of office; sending note to Dr Reece Agar and Dr Para March.Please advise.

## 2012-06-04 NOTE — Telephone Encounter (Signed)
Would suggest continue meds at least until blood work back.  I'm glad he's feeling better.  (consider stopping flagyl if all blood work stable.)

## 2012-06-04 NOTE — Assessment & Plan Note (Signed)
Likely another flare of diverticulitis in LLQ that was causing bladder sx also.  Nontoxic.  Check cbc/cmet in meantime. Urine unremarkable.  Start cipro/flagyl today and if worsening then to ER.  dw pt about the plan and he agrees. Would be reasonable to treat presumptively given his history and avoid delay in treatment (and also radiation exposure) with imaging. He agrees.  >25 min spent with face to face with patient, >50% counseling and/or coordinating care

## 2012-06-04 NOTE — Telephone Encounter (Signed)
I called pt. WBC count is up.  Overall improved but not fully resolved.  Still will occ lower abd pain but improved from yesterday. Would continue the meds until completion.  If worsening, then to ER.  He agrees.

## 2012-06-04 NOTE — Telephone Encounter (Signed)
Patient notified

## 2012-07-06 ENCOUNTER — Telehealth: Payer: Self-pay | Admitting: Family Medicine

## 2012-07-06 NOTE — Telephone Encounter (Signed)
[facility] Triage Call Report Triage Record Num: [id number] Operator: Edgar Frisk Patient Name: Sergio [name] Call Date & Time: 07/06/2012 12:56:07AM Patient Phone: [phone] PCP: Crawford Givens Patient Gender: Male PCP Fax : Patient DOB: [birth year] Practice Name: [facility] Reason for Call: Caller: Vitaly/Patient; PCP: Crawford Givens Clelia Croft) [facility]); CB#: [phone]; Call regarding Abdominal pain ; Pt reports has severe distention of abd with deep boring abd pain that started tonight. States is unbearable. Abdominal Distention Guideline - Neg responses til - Pain described as deep, boring, or tearing Disposition - See ED Immediately Protocol(s) Used: Abdominal Distention Recommended Outcome per Protocol: See ED Immediately Reason for Outcome: Pain described as deep, boring, or tearing Care Advice: ~ Another adult should drive. ~ Do not give the patient anything to eat or drink. 07/06/2012 1:03:34AM Page 1 of 1 [id number]

## 2012-07-06 NOTE — Telephone Encounter (Signed)
Please try to get update, records.  Thanks.

## 2012-07-06 NOTE — Telephone Encounter (Signed)
Patient went to ER in [place], [place] at 12 midnight and was released around 12 noon today.  Had a lot of tests and a CT today that revealed diverticulitis.  He was given meds for 14 days.  He states he would like to get an appt scheduled with his GI person but doesn't remember her name.  Patient says Dr. Para March knows her.

## 2012-07-07 ENCOUNTER — Telehealth: Payer: Self-pay | Admitting: Internal Medicine

## 2012-07-07 NOTE — Telephone Encounter (Signed)
I will see him 

## 2012-07-07 NOTE — Telephone Encounter (Signed)
Noted, thanks!

## 2012-07-07 NOTE — Telephone Encounter (Signed)
Spoke to patient and was advised that he has an appointment scheduled with Dr. Juanda Chance 08/04/12. Advised patient if he does not continue to improve to call and Dr. Para March will see him sooner. Patient states that he has some of the ER records and when he gets back into town he will deliver them to the office. Patient states that he will contact the ER and see about making sure that they fax a copy of all test results that were done there to Dr. Para March.

## 2012-07-07 NOTE — Telephone Encounter (Signed)
Patient is in [name], [place]. He states he had diverticulitis about a month ago and took Cipro and  Flagyl . He ended up in the ED in [facility] yesterday and is on Cipro and Flagyl for 14 days this time. He wants to schedule an OV with Dr. Juanda Chance to discuss these episodes. Scheduled patient on 08/04/12 at 2:30 PM.

## 2012-07-07 NOTE — Telephone Encounter (Signed)
He had seen Dr. Weyman Pedro.  Please see about getting records.  GI should be able to schedule f/u.  If he can't be seen soon by them, then f/u here is reasonable.  Thanks.

## 2012-07-08 ENCOUNTER — Other Ambulatory Visit: Payer: Self-pay | Admitting: Family Medicine

## 2012-07-08 ENCOUNTER — Encounter: Payer: Self-pay | Admitting: *Deleted

## 2012-07-08 DIAGNOSIS — E7800 Pure hypercholesterolemia, unspecified: Secondary | ICD-10-CM

## 2012-07-12 ENCOUNTER — Ambulatory Visit (INDEPENDENT_AMBULATORY_CARE_PROVIDER_SITE_OTHER): Payer: MEDICARE | Admitting: Family Medicine

## 2012-07-12 ENCOUNTER — Encounter: Payer: Self-pay | Admitting: Family Medicine

## 2012-07-12 VITALS — BP 120/70 | HR 66 | Temp 98.6°F | Wt 246.0 lb

## 2012-07-12 DIAGNOSIS — K5730 Diverticulosis of large intestine without perforation or abscess without bleeding: Secondary | ICD-10-CM

## 2012-07-12 MED ORDER — NYSTATIN 100000 UNIT/ML MT SUSP: 500000.0000 [IU] | Freq: Four times a day (QID) | OROMUCOSAL | Status: DC

## 2012-07-12 NOTE — Assessment & Plan Note (Signed)
Improved, continue the abx for now, will use nystatin if oral thrush worsens again.  Nontoxic.  D/w pt about path/phys and diet.  He'll f/u with GI.  I would like Dr. Regino Schultze advice for this patient.  He agrees with plan.

## 2012-07-12 NOTE — Progress Notes (Signed)
Was at [facility] when the abd pain started.  Was dx'd with diverticulitis with CT report noted. Started on cipro/flagyl. This was his third episode of symptoms.  Was discharged and since then his abd pain has improved.  In the interval had some thrush orally, a few days after starting the abx.  This is improving in the interval w/o intervention other than brushing his tongue and eating yogurt.  Normal BMs now, no abd pain.    Hospital records reviewed.  He has f/u with GI pending.    Meds, vitals, and allergies reviewed.   ROS: See HPI.  Otherwise, noncontributory.  GEN: nad, alert and oriented HEENT: mucous membranes moist, mild OP thrush noted.  NECK: supple w/o LA CV: rrr. PULM: ctab, no inc wob ABD: soft, +bs, no tttp EXT: no edema SKIN: no acute rash

## 2012-07-12 NOTE — Patient Instructions (Addendum)
Don't drink alcohol with the flagyl.  Take the nystatin if needed for the thrush.  Finish the antibiotics and keep the appointment with GI.  Take the CDs to the GI appointment in case Dr. Juanda Chance wants to see them.  Take care.  Glad to see you.

## 2012-07-19 ENCOUNTER — Other Ambulatory Visit: Payer: MEDICARE

## 2012-07-22 ENCOUNTER — Encounter: Payer: MEDICARE | Admitting: Family Medicine

## 2012-08-04 ENCOUNTER — Encounter: Payer: Self-pay | Admitting: Internal Medicine

## 2012-08-04 ENCOUNTER — Ambulatory Visit (INDEPENDENT_AMBULATORY_CARE_PROVIDER_SITE_OTHER): Payer: Medicare Other | Admitting: Internal Medicine

## 2012-08-04 VITALS — BP 110/70 | HR 60 | Ht 72.0 in | Wt 244.0 lb

## 2012-08-04 DIAGNOSIS — K5732 Diverticulitis of large intestine without perforation or abscess without bleeding: Secondary | ICD-10-CM

## 2012-08-04 DIAGNOSIS — R933 Abnormal findings on diagnostic imaging of other parts of digestive tract: Secondary | ICD-10-CM

## 2012-08-04 NOTE — Progress Notes (Signed)
Sergio [name] 03-12-1940 MRN [id number]        History of Present Illness:  This is a 72 year-old white male with recurrent episodes of sigmoid diverticulitis. First episode occurred in January 2007 and we saw him for that and treated him with antibiotics. Colonoscopy at that time showed adenomatous polyp and a left-sided diverticulosis. He had  colonoscopy in February 2012 and  had tubular adenoma removed and diverticulosis in the left colon. In  April 2014 while  in [place] on a business trip,he developed rather acute LLQ abd. pain and had to be  hospitalized overnight at [facility] and given intravenous antibiotics. CT scan of the abdomen confirmed 6 cm long segment of stranding in the sigmoid colon with inflammation consistent with acute diverticulitis. There was no abscess. He was again treated with Flagyl and Cipro for 10 days. The pain  subsided but recurred about 8 weeks later and again. was consistent with recurrent diverticulitis , or, rather.  continuation of the same diverticulitis. He took another 10 days of antibiotics and he is currently well. He denies any abdominal pain, irregular bowel habits or rectal bleeding. He has been gradually increasing his fiber    Past Medical History  Diagnosis Date  . Diverticulosis of colon 03/2005    via colonoscopy  . Hyperlipidemia 06/1995    started pravachol  . GERD (gastroesophageal reflux disease) 10/2005  . Arthritis     hand/finger OA  . Hyperglycemia   . Tubular adenoma of colon 04/29/10   Past Surgical History  Procedure Laterality Date  . Circumcision  09/1999    Dr. Artis Flock  . Palatoplasy      tonsils and adenoids  . Septoplasty      sleep apnea  . Knee arthroscopy  04/2003    left, Dr. Turner Daniels  . Knee arthroscopy  12/2004    right, Dr. Turner Daniels  . Umbilical hernia repair  10/18/2005    Dr. Derrell Lolling  . Shoulder surgery      Bilateral    reports that he has quit smoking. He has never used smokeless tobacco. He reports that he  drinks about 3.5 ounces of alcohol per week. He reports that he does not use illicit drugs. family history includes Breast cancer in his mother; Diabetes in his father and mother; Multiple sclerosis in his sister; and Pancreatic cancer in his mother.  There is no history of Colon cancer and Prostate cancer. No Known Allergies      Review of Systems: Denies upper GI symptoms  The remainder of the 10 point ROS is negative except as outlined in H&P   Physical Exam: General appearance  Well developed, in no distress. Eyes- non icteric. HEENT nontraumatic, normocephalic. Mouth no lesions, tongue papillated, no cheilosis. Neck supple without adenopathy, thyroid not enlarged, no carotid bruits, no JVD. Lungs Clear to auscultation bilaterally. Cor normal S1, normal S2, regular rhythm, no murmur,  quiet precordium. Abdomen: Large soft abdomen with normal active bowel sounds. No tenderness specifically left lower quadrant was completely unremarkable. Rectal: Soft Hemoccult negative stool Extremities no pedal edema. Skin no lesions. Neurological alert and oriented x 3. Psychological normal mood and affect.  Assessment and Plan:  72 year old white male with this episode" diverticulitis in 2007 and now second episode with the recurrence within 8 weeks after the first attack again responding to  oral antibiotics., uncomplicated, no obstruction or abscess. His brother had segmental resection and had some postoperative problems. We have discussed extensively  his dietary preferences  which are high fiber diet. He is slowly increasing his fiber to go 25 gm daily. HE WILL RESUME HIS  METAMUCIL 1 HEAPING TEASPOON  A DAY. I WOULD NOT RECOMMEND SURGERY AT THIS TIME,  I CONSIDER THE LAST 2 ATTACKS  THIS YEAR TO BE JUSTO 1 LONG ATTACK WITH THE INCOMPLETE HEALING. HIS  RECALL COLONOSCOPY WILL BE DUE IN FEBRUARY 2017 OR EARLIER IF HE KEEPS HAVING PROBLEMS WITH HIS COLON.   08/04/2012 Sergio [name]

## 2012-08-04 NOTE — Patient Instructions (Addendum)
Today we are giving you a high fiber diet to read and follow.    Also we are providing you with samples of the Metamucil , this is over the counter.  Dr Crawford Givens

## 2012-09-13 ENCOUNTER — Telehealth: Payer: Self-pay

## 2012-09-13 NOTE — Telephone Encounter (Signed)
Noted  

## 2012-09-13 NOTE — Telephone Encounter (Signed)
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 Pt scheduled 30 min appt on 09/17/12 at 10:30 with Dr Para March to discuss memory loss and any testing Dr Para March thinks needed. Advised pt if condition changes or worsens to call back prior to appt. Pt said Friday would be fine.

## 2012-09-17 ENCOUNTER — Encounter: Payer: Self-pay | Admitting: Family Medicine

## 2012-09-17 ENCOUNTER — Ambulatory Visit (INDEPENDENT_AMBULATORY_CARE_PROVIDER_SITE_OTHER): Payer: Medicare Other | Admitting: Family Medicine

## 2012-09-17 VITALS — BP 110/70 | HR 69 | Temp 98.2°F | Wt 251.5 lb

## 2012-09-17 DIAGNOSIS — Z711 Person with feared health complaint in whom no diagnosis is made: Secondary | ICD-10-CM

## 2012-09-17 NOTE — Progress Notes (Signed)
Wife had noted some memory changes, short term memory.  He wasn't as alarmed about it be had noted some changes- he doesn't recall names as well.  "I'll remember it after she reminds me."  Long term memory is intact. Wife questioned his sequential execution of a series of tasks but he didn't think this was a problem.  He has no red flag examples of high risk behavior.  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  His hearing was recently checked and was okay per his report.   Meds, vitals, and allergies reviewed.   ROS: See HPI.  Otherwise, noncontributory.  GEN: nad, alert and oriented HEENT: mucous membranes moist NECK: supple w/o LA CV: rrr.  PULM: ctab, no inc wob ABD: soft, +bs EXT: no edema SKIN: no acute rash CN 2-12 wnl B, S/S/DTR wnl x4 MMSE 30/30 and recall is quick, accurate.

## 2012-09-17 NOTE — Patient Instructions (Addendum)
If you notice changes in memory, then notify me.  Take care.

## 2012-09-19 DIAGNOSIS — Z711 Person with feared health complaint in whom no diagnosis is made: Secondary | ICD-10-CM | POA: Insufficient documentation

## 2012-09-19 NOTE — Assessment & Plan Note (Signed)
MMSE 30/30 and recall is sharp.  Reassured patient.  We can follow clinically.  I wouldn't do anything else now.  He'll update me and we can recheck periodically prn.  He agrees.  Unclear if some of this was related to attention, discussed.

## 2012-09-20 ENCOUNTER — Other Ambulatory Visit: Payer: Self-pay | Admitting: Family Medicine

## 2012-09-20 NOTE — Telephone Encounter (Signed)
Electronic refill request. Does patient need CPE?  Please advise.

## 2012-09-20 NOTE — Telephone Encounter (Signed)
No, sent. Thanks.

## 2013-01-04 ENCOUNTER — Encounter: Payer: Self-pay | Admitting: Family Medicine

## 2013-01-04 ENCOUNTER — Ambulatory Visit (INDEPENDENT_AMBULATORY_CARE_PROVIDER_SITE_OTHER): Payer: Medicare Other | Admitting: Family Medicine

## 2013-01-04 VITALS — BP 122/78 | HR 63 | Temp 98.1°F | Wt 251.5 lb

## 2013-01-04 DIAGNOSIS — M549 Dorsalgia, unspecified: Secondary | ICD-10-CM

## 2013-01-04 NOTE — Assessment & Plan Note (Signed)
Likely muscle strain, would use celebrex prn and this should resolve.  He is likely at the low pain for pain given the timeline.  F/u prn.  He agrees.

## 2013-01-04 NOTE — Patient Instructions (Signed)
Take the celebrex for pain and keep moving/stretching.

## 2013-01-04 NOTE — Progress Notes (Signed)
Lower L back/side pain going on for about 3-4 days.  Doesn't feel similar to prev diverticulitis pain. Started about 1 day after working out.  Noted that he had laid off exercise for awhile before going back.  He was on the elliptical for about 30 min.  He is going to leave town soon and wanted it checked before leaving.  No FCNAVD.  Sharp pain with pressing on the area or certain movements.  No rash.  No abd pain and no R sided pain.    Meds, vitals, and allergies reviewed.   ROS: See HPI.  Otherwise, noncontributory.  nad ncat rrr ctab Back w/o midline pain Point TTP in the lateral L lower back, posterior to but near the midaxillary line, pain reproduced with movement.  abd soft, not ttp, normal BS No rash.

## 2013-01-06 ENCOUNTER — Other Ambulatory Visit: Payer: Self-pay

## 2013-03-01 ENCOUNTER — Telehealth: Payer: Self-pay | Admitting: Family Medicine

## 2013-03-01 ENCOUNTER — Encounter: Payer: Self-pay | Admitting: Family Medicine

## 2013-03-01 ENCOUNTER — Ambulatory Visit (INDEPENDENT_AMBULATORY_CARE_PROVIDER_SITE_OTHER): Payer: Medicare Other | Admitting: Family Medicine

## 2013-03-01 VITALS — BP 118/72 | HR 78 | Temp 98.4°F | Wt 250.8 lb

## 2013-03-01 DIAGNOSIS — J020 Streptococcal pharyngitis: Secondary | ICD-10-CM

## 2013-03-01 DIAGNOSIS — J069 Acute upper respiratory infection, unspecified: Secondary | ICD-10-CM | POA: Insufficient documentation

## 2013-03-01 NOTE — Patient Instructions (Signed)
Drink plenty of fluids, take tylenol as needed, and gargle with warm salt water for your throat.  This should gradually improve.  Take care.  Let us know if you have other concerns.    

## 2013-03-01 NOTE — Telephone Encounter (Signed)
Please have Sauls open the afternoon and put him on at [facility].  Thanks.

## 2013-03-01 NOTE — Progress Notes (Signed)
Pre-visit discussion using our clinic review tool. No additional management support is needed unless otherwise documented below in the visit note.  Sx started a few days ago. Voice is altered.  ST, pain with swallowing.  Throat feel irritated and dry.  No FCNAVD.  Gargling with salt water helps for a little while.  Minimal cough, clear sputum.    Meds, vitals, and allergies reviewed.   ROS: See HPI.  Otherwise, noncontributory.  GEN: nad, alert and oriented HEENT: mucous membranes moist, tm w/o erythema, nasal exam w/o erythema, clear discharge noted,  OP with cobblestoning NECK: supple w/o LA CV: rrr.   PULM: ctab, no inc wob EXT: no edema SKIN: no acute rash  RST neg.

## 2013-03-01 NOTE — Telephone Encounter (Signed)
Appt scheduled

## 2013-03-01 NOTE — Assessment & Plan Note (Signed)
rst neg, likely viral. ddx d/w pt.  Supportive care. Fu prn.

## 2013-03-01 NOTE — Telephone Encounter (Signed)
Patient Information:  Caller Name: Aemon  Phone: [phone]  Patient: Ramil, [place]  Gender: Male  DOB: 1940-12-16  Age: 72 Years  PCP: Crawford Givens Clelia Croft) [facility])  Office Follow Up:  Does the office need to follow up with this patient?: Yes  Instructions For The Office: Patient needing Appt for very sore throat, painful swallowing x3 days. Appt not available in EPIC - Please review -- contact patient at  [phone] -- is very close to office, can come until today until 3:30 pm.   Symptoms  Reason For Call & Symptoms: Has very sore throat when wakes in the mornings, cough with clear to yellow sputum.  Throat very red near tongue.  Hurts to swallow.  Reviewed Health History In EMR: Yes  Reviewed Medications In EMR: Yes  Reviewed Allergies In EMR: Yes  Reviewed Surgeries / Procedures: Yes  Date of Onset of Symptoms: 02/26/2013  Treatments Tried: gargles with hot saline, drinking hot coffee helps but pain comes back Cough drops make production of mucus worse.  Treatments Tried Worked: No  Guideline(s) Used:  Sore Throat  Disposition Per Guideline:   See Today in Office  Reason For Disposition Reached:   Patient wants to be seen  Advice Given:  N/A  Patient Will Follow Care Advice:  YES

## 2013-05-16 ENCOUNTER — Ambulatory Visit (INDEPENDENT_AMBULATORY_CARE_PROVIDER_SITE_OTHER): Payer: Medicare HMO | Admitting: Neurology

## 2013-05-16 ENCOUNTER — Ambulatory Visit (INDEPENDENT_AMBULATORY_CARE_PROVIDER_SITE_OTHER): Payer: Self-pay

## 2013-05-16 DIAGNOSIS — M79609 Pain in unspecified limb: Secondary | ICD-10-CM

## 2013-05-16 DIAGNOSIS — Z0289 Encounter for other administrative examinations: Secondary | ICD-10-CM

## 2013-05-16 NOTE — Procedures (Signed)
     HISTORY:  Sergio [name] is a 74 year old gentleman with onset of left hip discomfort 6-7 weeks ago. The patient was bench pressing 300 pounds with his legs, and noted onset of discomfort the next day. The patient indicates that the pain is present when walking uphill, or with abduction of the legs. The patient is sent for evaluation of a possible neuropathy or a radiculopathy.  NERVE CONDUCTION STUDIES:  Nerve conduction studies were performed on both lower extremities. The distal motor latencies and motor amplitudes for the peroneal and posterior tibial nerves were within normal limits. The nerve conduction velocities for these nerves were also normal. The H reflex latencies were slightly prolonged bilaterally. The sensory latencies for the peroneal nerves and the saphenous sensory nerves were within normal limits.   EMG STUDIES:  EMG study was performed on the left lower extremity:  The tibialis anterior muscle reveals 2 to 4K motor units with full recruitment. No fibrillations or positive waves were seen. The peroneus tertius muscle reveals 2 to 5K motor units with full recruitment. No fibrillations or positive waves were seen. The medial gastrocnemius muscle reveals 1 to 3K motor units with full recruitment. No fibrillations or positive waves were seen. The vastus lateralis muscle reveals 2 to 4K motor units with full recruitment. No fibrillations or positive waves were seen. The iliopsoas muscle reveals 2 to 4K motor units with full recruitment. No fibrillations or positive waves were seen. The biceps femoris muscle (long head) reveals 2 to 4K motor units with full recruitment. No fibrillations or positive waves were seen. The gluteus medius muscle reveals 1 to 3K motor units with full recruitment. No fibrillations or positive waves were seen. The lumbosacral paraspinal muscles were tested at 3 levels, and revealed no abnormalities of insertional activity at all 3 levels tested.  There was good relaxation.   IMPRESSION:  Nerve conduction studies done on both lower extremities were relatively unremarkable. No evidence of a peripheral neuropathy was seen. EMG evaluation of the right lower extremity was unremarkable, without evidence of an overlying lumbosacral radiculopathy.  Jill Alexanders MD 05/16/2013 1:53 PM  [facility] [address] [place], [place] [address]  Phone [phone] Fax [phone]

## 2013-07-15 ENCOUNTER — Encounter: Payer: Self-pay | Admitting: Family Medicine

## 2013-07-15 ENCOUNTER — Ambulatory Visit (INDEPENDENT_AMBULATORY_CARE_PROVIDER_SITE_OTHER): Payer: Medicare HMO | Admitting: Family Medicine

## 2013-07-15 VITALS — BP 104/70 | HR 72 | Temp 98.1°F | Wt 254.5 lb

## 2013-07-15 DIAGNOSIS — T148 Other injury of unspecified body region: Secondary | ICD-10-CM

## 2013-07-15 DIAGNOSIS — W57XXXA Bitten or stung by nonvenomous insect and other nonvenomous arthropods, initial encounter: Secondary | ICD-10-CM

## 2013-07-15 NOTE — Progress Notes (Signed)
Pre visit review using our clinic review tool, if applicable. No additional management support is needed unless otherwise documented below in the visit note.  Tick bite.  Working on his deck.  He thought it was just a scratch, then realized it was a tick.  He didn't remove it yet. No systemic sx.    Meds, vitals, and allergies reviewed.   ROS: See HPI.  Otherwise, noncontributory.  nad ncat Skin exam: single tick noted on the R tragus.  Adherent but not engorged.  Easily and completely removed with tweezers.  No retained parts on magnification.  Skin wnl o/w at the site.

## 2013-07-15 NOTE — Patient Instructions (Signed)
DEET spray may help in the meantime.  Take care. Glad to see you.

## 2013-07-15 NOTE — Assessment & Plan Note (Signed)
R tragus. Adherent but not engorged. Easily and completely removed with tweezers. No retained parts on magnification. Skin wnl o/w at the site.  No systemic sx.  F/u prn. D/w pt about using DEET when outside.

## 2013-07-18 ENCOUNTER — Ambulatory Visit: Payer: Medicare Other | Admitting: Family Medicine

## 2013-08-26 ENCOUNTER — Other Ambulatory Visit: Payer: Self-pay | Admitting: Family Medicine

## 2013-08-26 NOTE — Telephone Encounter (Signed)
Electronic refill request. Last Filled:   180 capsule 3 RF on   04/05/2012.  Please advise.

## 2013-08-27 NOTE — Telephone Encounter (Signed)
Sent, looks to be due for CPE.  Thanks.

## 2013-08-29 NOTE — Telephone Encounter (Signed)
Left detailed message on voicemail.  

## 2013-09-03 ENCOUNTER — Other Ambulatory Visit: Payer: Self-pay | Admitting: Family Medicine

## 2013-09-03 DIAGNOSIS — E785 Hyperlipidemia, unspecified: Secondary | ICD-10-CM

## 2013-09-03 DIAGNOSIS — Z125 Encounter for screening for malignant neoplasm of prostate: Secondary | ICD-10-CM

## 2013-09-03 DIAGNOSIS — R739 Hyperglycemia, unspecified: Secondary | ICD-10-CM

## 2013-09-07 ENCOUNTER — Other Ambulatory Visit: Payer: Self-pay | Admitting: Family Medicine

## 2013-09-08 ENCOUNTER — Other Ambulatory Visit (INDEPENDENT_AMBULATORY_CARE_PROVIDER_SITE_OTHER): Payer: Medicare HMO

## 2013-09-08 DIAGNOSIS — E785 Hyperlipidemia, unspecified: Secondary | ICD-10-CM

## 2013-09-08 DIAGNOSIS — R7309 Other abnormal glucose: Secondary | ICD-10-CM

## 2013-09-08 DIAGNOSIS — Z125 Encounter for screening for malignant neoplasm of prostate: Secondary | ICD-10-CM

## 2013-09-08 DIAGNOSIS — R739 Hyperglycemia, unspecified: Secondary | ICD-10-CM

## 2013-09-08 LAB — LIPID PANEL
Cholesterol: 162 mg/dL (ref 0–200)
HDL: 47.3 mg/dL (ref >39.00)
LDL Cholesterol: 94 mg/dL (ref 0–99)
NonHDL: 114.7
TRIGLYCERIDES: 105 mg/dL (ref 0.0–149.0)
Total CHOL/HDL Ratio: 3
VLDL: 21 mg/dL (ref 0.0–40.0)

## 2013-09-08 LAB — COMPREHENSIVE METABOLIC PANEL
ALT: 29 U/L (ref 0–53)
AST: 27 U/L (ref 0–37)
Albumin: 3.7 g/dL (ref 3.5–5.2)
Alkaline Phosphatase: 67 U/L (ref 39–117)
BUN: 17 mg/dL (ref 6–23)
CALCIUM: 9.1 mg/dL (ref 8.4–10.5)
CO2: 30 meq/L (ref 19–32)
Chloride: 104 mEq/L (ref 96–112)
Creatinine, Ser: 0.9 mg/dL (ref 0.4–1.5)
GFR: 91.73 mL/min (ref >60.00)
GLUCOSE: 117 mg/dL — AB (ref 70–99)
POTASSIUM: 4.6 meq/L (ref 3.5–5.1)
SODIUM: 137 meq/L (ref 135–145)
TOTAL PROTEIN: 6.1 g/dL (ref 6.0–8.3)
Total Bilirubin: 0.8 mg/dL (ref 0.2–1.2)

## 2013-09-08 LAB — HEMOGLOBIN A1C: Hgb A1c MFr Bld: 6 % (ref 4.6–6.5)

## 2013-09-08 LAB — PSA, MEDICARE: PSA: 0.69 ng/ml (ref 0.10–4.00)

## 2013-09-15 ENCOUNTER — Encounter: Payer: Self-pay | Admitting: Family Medicine

## 2013-09-15 ENCOUNTER — Ambulatory Visit (INDEPENDENT_AMBULATORY_CARE_PROVIDER_SITE_OTHER): Payer: Medicare HMO | Admitting: Family Medicine

## 2013-09-15 VITALS — BP 116/64 | HR 66 | Temp 98.1°F | Ht 73.0 in | Wt 255.0 lb

## 2013-09-15 DIAGNOSIS — Z0000 Encounter for general adult medical examination without abnormal findings: Secondary | ICD-10-CM

## 2013-09-15 DIAGNOSIS — E785 Hyperlipidemia, unspecified: Secondary | ICD-10-CM

## 2013-09-15 DIAGNOSIS — Z23 Encounter for immunization: Secondary | ICD-10-CM

## 2013-09-15 DIAGNOSIS — R739 Hyperglycemia, unspecified: Secondary | ICD-10-CM

## 2013-09-15 MED ORDER — PRAVASTATIN SODIUM 80 MG PO TABS: ORAL_TABLET | ORAL | Status: DC

## 2013-09-15 NOTE — Assessment & Plan Note (Signed)
D/w pt about diet and exercise.  F/u prn.

## 2013-09-15 NOTE — Progress Notes (Signed)
Pre visit review using our clinic review tool, if applicable. No additional management support is needed unless otherwise documented below in the visit note.  I have personally reviewed the Medicare Annual Wellness questionnaire and have noted 1. The patient's medical and social history 2. Their use of alcohol, tobacco or illicit drugs 3. Their current medications and supplements 4. The patient's functional ability including ADL's, fall risks, home safety risks and hearing or visual             impairment. 5. Diet and physical activities 6. Evidence for depression or mood disorders  The patients weight, height, BMI have been recorded in the chart and visual acuity is per eye clinic.  I have made referrals, counseling and provided education to the patient based review of the above and I have provided the pt with a written personalized care plan for preventive services.  Provider list updated- see scanned forms.  Routine anticipatory guidance given to patient.  See health maintenance.  Flu 2014 Shingles 2010 PNA 2011 Tetanus 2010 Colonoscopy 2012 Prostate cancer screening- PSA 2015 Advance directive - wife designated if patient were incapacitated.   Cognitive function addressed- see scanned forms- and if abnormal then additional documentation follows.  Mild inc in sugar noted.  D/w pt re: diet and exercise.  A1c okay for now.    Elevated Cholesterol: Using medications without problems:yes Muscle aches: no Diet compliance:yes Exercise:yes    PMH and SH reviewed  Meds, vitals, and allergies reviewed.   ROS: See HPI.  Otherwise negative.    GEN: nad, alert and oriented HEENT: mucous membranes moist NECK: supple w/o LA CV: rrr. PULM: ctab, no inc wob ABD: soft, +bs EXT: no edema SKIN: no acute rash but 3 mm macule noted inferior to R eye- he'll f/u with derm about this.

## 2013-09-15 NOTE — Assessment & Plan Note (Signed)
Controlled, continue as is.  

## 2013-09-15 NOTE — Patient Instructions (Signed)
Don't change your meds.  Ask dermatology about the spot under your right eye.  Take care.  Glad to see you.  Keep working on your weight.  Recheck 1 year.

## 2013-09-15 NOTE — Assessment & Plan Note (Signed)
Flu 2014 Shingles 2010 PNA 2011 Tetanus 2010 Colonoscopy 2012 Prostate cancer screening- PSA 2015 Advance directive - wife designated if patient were incapacitated.   Cognitive function addressed- see scanned forms- and if abnormal then additional documentation follows.  Mild inc in sugar noted.  D/w pt re: diet and exercise.  A1c okay for now.

## 2013-10-13 ENCOUNTER — Encounter: Payer: Self-pay | Admitting: Internal Medicine

## 2013-11-08 ENCOUNTER — Ambulatory Visit (INDEPENDENT_AMBULATORY_CARE_PROVIDER_SITE_OTHER): Payer: Medicare HMO | Admitting: Family Medicine

## 2013-11-08 ENCOUNTER — Encounter: Payer: Self-pay | Admitting: Family Medicine

## 2013-11-08 ENCOUNTER — Encounter: Payer: Self-pay | Admitting: *Deleted

## 2013-11-08 ENCOUNTER — Ambulatory Visit (INDEPENDENT_AMBULATORY_CARE_PROVIDER_SITE_OTHER)
Admission: RE | Admit: 2013-11-08 | Discharge: 2013-11-08 | Disposition: A | Discharge status: Home / Self Care | Payer: Medicare HMO | Source: Ambulatory Visit | Attending: Family Medicine | Admitting: Family Medicine

## 2013-11-08 VITALS — BP 110/60 | HR 62 | Wt 254.0 lb

## 2013-11-08 DIAGNOSIS — M25532 Pain in left wrist: Secondary | ICD-10-CM | POA: Insufficient documentation

## 2013-11-08 DIAGNOSIS — M25539 Pain in unspecified wrist: Secondary | ICD-10-CM

## 2013-11-08 NOTE — Patient Instructions (Signed)
Take aleve with food daily.  Wear the brace during the day and we'll contact you with the xray report.  You'll likely wean out of the brace in the next 1-2 weeks.  Take care.  Glad to see you.

## 2013-11-08 NOTE — Telephone Encounter (Signed)
Encounter opened in error

## 2013-11-08 NOTE — Progress Notes (Signed)
Pre visit review using our clinic review tool, if applicable. No additional management support is needed unless otherwise documented below in the visit note.  L wrist pain.  Going on for a few weeks.  Bruised initially.  Bruising resolved but still with pain.  Pain with making a fist and flexing the wrist concurrently.  More pain lifting.  No pain if in neutral position. "If I load it the wrong way, it does something."    Meds, vitals, and allergies reviewed.   ROS: See HPI.  Otherwise, noncontributory.  nad Normal L elbow rom Pain with L wrist rom, flexion and ext.  Tender near the scaphoid, normal radial pulse, no bruising.   finkelstein neg.  Distally nv intact with some chronic IP joint changes noted, no acute changes.   Pain much improved- resolved- in wrist splint Xray neg

## 2013-11-09 DIAGNOSIS — M25539 Pain in unspecified wrist: Secondary | ICD-10-CM | POA: Insufficient documentation

## 2013-11-09 NOTE — Assessment & Plan Note (Signed)
Pain much improved- resolved- in wrist splint.  Xray neg Likely soft tissue injury, should be able to wean out of splint f/u prn.   He agrees.

## 2014-03-03 DIAGNOSIS — T8859XA Other complications of anesthesia, initial encounter: Secondary | ICD-10-CM

## 2014-03-03 HISTORY — DX: Other complications of anesthesia, initial encounter: T88.59XA

## 2014-05-23 ENCOUNTER — Ambulatory Visit (INDEPENDENT_AMBULATORY_CARE_PROVIDER_SITE_OTHER): Payer: PPO | Admitting: Family Medicine

## 2014-05-23 ENCOUNTER — Encounter: Payer: Self-pay | Admitting: Family Medicine

## 2014-05-23 VITALS — BP 110/60 | HR 63 | Temp 97.9°F | Wt 254.8 lb

## 2014-05-23 DIAGNOSIS — J392 Other diseases of pharynx: Secondary | ICD-10-CM

## 2014-05-23 NOTE — Progress Notes (Signed)
Pre visit review using our clinic review tool, if applicable. No additional management support is needed unless otherwise documented below in the visit note.  "Feels like something is in his throat."  Last night was eating dinner and "swallowed wrong."  Coughing there after, and through the night.  Stills has FB sensation but no stridor.  It is about the same as last night, not worse.  No vomiting, no diarrhea.  Can still swallow food/liquid now.  No heartburn.  Scant rhinorrhea, likely from coughing last night.    Meds, vitals, and allergies reviewed.   ROS: See HPI.  Otherwise, noncontributory.  nad ncat Tm wnl Nasal and OP exam wnl, no FB, no masses Neck supple, no LA, no stridor rrr Ctab, no wheeze

## 2014-05-23 NOTE — Patient Instructions (Signed)
Gargle with salt water.  Likely foreign body sensation, but not a true foreign body.   Take care.  Update me if not better.

## 2014-05-24 DIAGNOSIS — J392 Other diseases of pharynx: Secondary | ICD-10-CM | POA: Insufficient documentation

## 2014-05-24 NOTE — Assessment & Plan Note (Signed)
Can swallow now, normal voice, no stridor.  Likely local irritation from the abnormal swallow/food bolus.  Likely sensation FB, but not a true FB.  D/w pt.  He agrees.  Gargle with salt water, f/u prn.  Should resolve. He agrees.

## 2014-08-10 ENCOUNTER — Encounter: Payer: Self-pay | Admitting: Internal Medicine

## 2014-08-10 ENCOUNTER — Ambulatory Visit (INDEPENDENT_AMBULATORY_CARE_PROVIDER_SITE_OTHER): Payer: PPO | Admitting: Internal Medicine

## 2014-08-10 VITALS — BP 112/78 | HR 78 | Temp 98.3°F | Wt 254.0 lb

## 2014-08-10 DIAGNOSIS — J309 Allergic rhinitis, unspecified: Secondary | ICD-10-CM | POA: Diagnosis not present

## 2014-08-10 DIAGNOSIS — M79662 Pain in left lower leg: Secondary | ICD-10-CM | POA: Diagnosis not present

## 2014-08-10 NOTE — Progress Notes (Signed)
Pre visit review using our clinic review tool, if applicable. No additional management support is needed unless otherwise documented below in the visit note. 

## 2014-08-10 NOTE — Patient Instructions (Addendum)
Take Zyrtec and Flonase OTC Take Flonase in the morning, Zyrtec at night daily x 1 week  Allergic Rhinitis Allergic rhinitis is when the mucous membranes in the nose respond to allergens. Allergens are particles in the air that cause your body to have an allergic reaction. This causes you to release allergic antibodies. Through a chain of events, these eventually cause you to release histamine into the blood stream. Although meant to protect the body, it is this release of histamine that causes your discomfort, such as frequent sneezing, congestion, and an itchy, runny nose.  CAUSES  Seasonal allergic rhinitis (hay fever) is caused by pollen allergens that may come from grasses, trees, and weeds. Year-round allergic rhinitis (perennial allergic rhinitis) is caused by allergens such as house dust mites, pet dander, and mold spores.  SYMPTOMS   Nasal stuffiness (congestion).  Itchy, runny nose with sneezing and tearing of the eyes. DIAGNOSIS  Your health care provider can help you determine the allergen or allergens that trigger your symptoms. If you and your health care provider are unable to determine the allergen, skin or blood testing may be used. TREATMENT  Allergic rhinitis does not have a cure, but it can be controlled by:  Medicines and allergy shots (immunotherapy).  Avoiding the allergen. Hay fever may often be treated with antihistamines in pill or nasal spray forms. Antihistamines block the effects of histamine. There are over-the-counter medicines that may help with nasal congestion and swelling around the eyes. Check with your health care provider before taking or giving this medicine.  If avoiding the allergen or the medicine prescribed do not work, there are many new medicines your health care provider can prescribe. Stronger medicine may be used if initial measures are ineffective. Desensitizing injections can be used if medicine and avoidance does not work. Desensitization is  when a patient is given ongoing shots until the body becomes less sensitive to the allergen. Make sure you follow up with your health care provider if problems continue. HOME CARE INSTRUCTIONS It is not possible to completely avoid allergens, but you can reduce your symptoms by taking steps to limit your exposure to them. It helps to know exactly what you are allergic to so that you can avoid your specific triggers. SEEK MEDICAL CARE IF:   You have a fever.  You develop a cough that does not stop easily (persistent).  You have shortness of breath.  You start wheezing.  Symptoms interfere with normal daily activities. Document Released: 11/12/2000 Document Revised: 02/22/2013 Document Reviewed: 10/25/2012 [facility] Patient Information 2015 [place], [place]. This information is not intended to replace advice given to you by your health care provider. Make sure you discuss any questions you have with your health care provider.

## 2014-08-10 NOTE — Progress Notes (Signed)
HPI  Pt presents to the clinic today with c/o cough, chest congestion and body aches. This started 3 days ago. The cough is productive of yellow mucous. He does have some associated nasal congestion, post nasal drip and mild shortness of breath which seems worse with his coughing spells. He denies chest pain or chest tightness. Most of the muscle pain is located in his left calf. He denies any injury to the area. He has not noticed any skin changes, swelling or bruising. He denies numbness or tingling. His symptoms seem worse at night. He has no history of allergies or breathing problems. He has not had sick contacts that he is aware of. He does not currently smoke.  Review of Systems      Past Medical History  Diagnosis Date  . Diverticulosis of colon 03/2005    via colonoscopy  . Hyperlipidemia 06/1995    started pravachol  . GERD (gastroesophageal reflux disease) 10/2005  . Arthritis     hand/finger OA  . Hyperglycemia   . Tubular adenoma of colon 04/29/10    Family History  Problem Relation Age of Onset  . Diabetes Mother     glucose intolerance  . Breast cancer Mother     deceased age 25  . Diabetes Father     partial left foot amputation, burn infection  . Multiple sclerosis Sister     deceased age 74  . Colon cancer Neg Hx   . Prostate cancer Neg Hx   . Pancreatic cancer Mother     History   Social History  . Marital Status: Married    Spouse Name: N/A  . Number of Children: 2  . Years of Education: N/A   Occupational History  . 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 Smoking status: Former Research scientist (life sciences)  . Smokeless tobacco: Never Used     Comment: smoked cigars rarely  . Alcohol Use: 4.2 - 9.0 oz/week    7-15 Standard drinks or equivalent per week     Comment: daily  . Drug Use: No  . Sexual Activity: Yes   Other Topics Concern  . 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  HEENT:  Positive post nasal drip, nasal congestion. Denies eye redness, eye pain, pressure behind the eyes, facial pain, ear pain, ringing in the ears, wax buildup, runny nose or sore throat. Respiratory: Positive cough, shortness of breath. Denies difficulty breathing or shortness of breath.  Cardiovascular: Denies chest pain, chest tightness, palpitations or swelling in the hands or feet.  MSK: Pt reports body aches and left calf pain. Denies decrease in ROM.  No other specific complaints in a complete review of systems (except as listed in HPI above).  Objective:   BP 112/78 mmHg  Pulse 78  Temp(Src) 98.3 F (36.8 C) (Oral)  Wt 254 lb (115.214 kg)  SpO2 97%  Wt Readings from Last 3 Encounters:  08/10/14 254 lb (115.214 kg)  05/23/14 254 lb 12 oz (115.554 kg)  11/08/13 254 lb (115.214 kg)     General: Appears his stated age, well developed, well nourished in  NAD. HEENT: Head: normal shape and size, no sinus tenderness noted; Eyes: sclera white, no icterus, conjunctiva pinkt; Ears: cerumen impaction bilatrally; Nose: mucosa boggy and moist, septum midline; Throat/Mouth: + PND. Teeth present, mucosa pink and moist, no exudate noted, no lesions or ulcerations noted.  Neck: No lymphadenopathy. Cardiovascular: Normal rate and rhythm. S1,S2 noted.  No murmur, rubs or gallops noted.  Pulmonary/Chest: Normal effort and positive vesicular breath sounds. No respiratory distress. No wheezes, rales or ronchi noted.  MSK: Calf circumference symmetrical. No swelling or pain with palpation. Negative Homans sign.    Assessment & Plan:   Allergic Rhinitis:  Start Zyrtec and Flonase OTC Watch for fever, facial pressure, pain in teeth, etc  Left calf pain:  No real  concern for DVT Doesn't sound like it is statin induced ? Strain Stretching exercises given Try Biofreeze or Bengay Follow up with PCP if pain persist  RTC as needed or if symptoms persist.

## 2014-08-11 ENCOUNTER — Encounter: Payer: Self-pay | Admitting: Internal Medicine

## 2014-08-11 ENCOUNTER — Encounter (HOSPITAL_COMMUNITY): Payer: Self-pay

## 2014-08-11 ENCOUNTER — Observation Stay (HOSPITAL_COMMUNITY)
Admission: AD | Admit: 2014-08-11 | Discharge: 2014-08-13 | Disposition: A | Discharge status: Home / Self Care | Payer: PPO | Source: Ambulatory Visit | Attending: Internal Medicine | Admitting: Internal Medicine

## 2014-08-11 ENCOUNTER — Telehealth: Payer: Self-pay | Admitting: Internal Medicine

## 2014-08-11 ENCOUNTER — Ambulatory Visit (INDEPENDENT_AMBULATORY_CARE_PROVIDER_SITE_OTHER): Payer: PPO | Admitting: Internal Medicine

## 2014-08-11 ENCOUNTER — Observation Stay (HOSPITAL_COMMUNITY): Payer: PPO

## 2014-08-11 VITALS — BP 128/64 | HR 54 | Ht 73.0 in | Wt 252.0 lb

## 2014-08-11 DIAGNOSIS — R1032 Left lower quadrant pain: Secondary | ICD-10-CM

## 2014-08-11 DIAGNOSIS — R109 Unspecified abdominal pain: Secondary | ICD-10-CM | POA: Diagnosis present

## 2014-08-11 DIAGNOSIS — Z87891 Personal history of nicotine dependence: Secondary | ICD-10-CM | POA: Insufficient documentation

## 2014-08-11 DIAGNOSIS — K5730 Diverticulosis of large intestine without perforation or abscess without bleeding: Secondary | ICD-10-CM

## 2014-08-11 DIAGNOSIS — K5732 Diverticulitis of large intestine without perforation or abscess without bleeding: Secondary | ICD-10-CM | POA: Diagnosis not present

## 2014-08-11 DIAGNOSIS — K219 Gastro-esophageal reflux disease without esophagitis: Secondary | ICD-10-CM | POA: Insufficient documentation

## 2014-08-11 DIAGNOSIS — K5900 Constipation, unspecified: Secondary | ICD-10-CM | POA: Insufficient documentation

## 2014-08-11 DIAGNOSIS — Z7982 Long term (current) use of aspirin: Secondary | ICD-10-CM | POA: Insufficient documentation

## 2014-08-11 DIAGNOSIS — Z791 Long term (current) use of non-steroidal anti-inflammatories (NSAID): Secondary | ICD-10-CM | POA: Diagnosis not present

## 2014-08-11 DIAGNOSIS — E785 Hyperlipidemia, unspecified: Secondary | ICD-10-CM | POA: Diagnosis not present

## 2014-08-11 DIAGNOSIS — M19049 Primary osteoarthritis, unspecified hand: Secondary | ICD-10-CM | POA: Insufficient documentation

## 2014-08-11 DIAGNOSIS — Z79899 Other long term (current) drug therapy: Secondary | ICD-10-CM | POA: Diagnosis not present

## 2014-08-11 DIAGNOSIS — K5792 Diverticulitis of intestine, part unspecified, without perforation or abscess without bleeding: Secondary | ICD-10-CM | POA: Diagnosis not present

## 2014-08-11 LAB — CBC
HCT: 46 % (ref 39.0–52.0)
HEMOGLOBIN: 14.9 g/dL (ref 13.0–17.0)
MCH: 29.6 pg (ref 26.0–34.0)
MCHC: 32.4 g/dL (ref 30.0–36.0)
MCV: 91.5 fL (ref 78.0–100.0)
Platelets: 158 10*3/uL (ref 150–400)
RBC: 5.03 MIL/uL (ref 4.22–5.81)
RDW: 13.3 % (ref 11.5–15.5)
WBC: 9.4 10*3/uL (ref 4.0–10.5)

## 2014-08-11 LAB — URINALYSIS, ROUTINE W REFLEX MICROSCOPIC
Bilirubin Urine: NEGATIVE
Glucose, UA: NEGATIVE mg/dL
Hgb urine dipstick: NEGATIVE
Ketones, ur: NEGATIVE mg/dL
LEUKOCYTES UA: NEGATIVE
Nitrite: NEGATIVE
Protein, ur: NEGATIVE mg/dL
Specific Gravity, Urine: 1.021 (ref 1.005–1.030)
Urobilinogen, UA: 0.2 mg/dL (ref 0.0–1.0)
pH: 5 (ref 5.0–8.0)

## 2014-08-11 LAB — COMPREHENSIVE METABOLIC PANEL
ALT: 26 U/L (ref 17–63)
AST: 23 U/L (ref 15–41)
Albumin: 4 g/dL (ref 3.5–5.0)
Alkaline Phosphatase: 89 U/L (ref 38–126)
Anion gap: 8 (ref 5–15)
BUN: 18 mg/dL (ref 6–20)
CHLORIDE: 104 mmol/L (ref 101–111)
CO2: 26 mmol/L (ref 22–32)
CREATININE: 0.93 mg/dL (ref 0.61–1.24)
Calcium: 9.2 mg/dL (ref 8.9–10.3)
GFR calc non Af Amer: >60 mL/min (ref >60)
GLUCOSE: 101 mg/dL — AB (ref 65–99)
POTASSIUM: 4.4 mmol/L (ref 3.5–5.1)
SODIUM: 138 mmol/L (ref 135–145)
Total Bilirubin: 0.8 mg/dL (ref 0.3–1.2)
Total Protein: 7.5 g/dL (ref 6.5–8.1)

## 2014-08-11 LAB — LIPASE, BLOOD: LIPASE: 20 U/L — AB (ref 22–51)

## 2014-08-11 MED ORDER — MORPHINE SULFATE 2 MG/ML IJ SOLN: 2.0000 mg | Freq: Once | INTRAMUSCULAR | Status: DC

## 2014-08-11 MED ORDER — ACETAMINOPHEN 650 MG RE SUPP: 650.0000 mg | Freq: Four times a day (QID) | RECTAL | Status: DC | PRN

## 2014-08-11 MED ORDER — SODIUM CHLORIDE 0.9 % IV BOLUS (SEPSIS)
1000.0000 mL | Freq: Once | INTRAVENOUS | Status: AC
  Administered 2014-08-11: 1000 mL via INTRAVENOUS

## 2014-08-11 MED ORDER — MORPHINE SULFATE 2 MG/ML IJ SOLN
INTRAMUSCULAR | Status: AC
  Filled 2014-08-11: qty 1

## 2014-08-11 MED ORDER — IOHEXOL 300 MG/ML  SOLN
50.0000 mL | Freq: Once | INTRAMUSCULAR | Status: AC | PRN
  Administered 2014-08-11: 50 mL via ORAL

## 2014-08-11 MED ORDER — SODIUM CHLORIDE 0.9 % IV SOLN
INTRAVENOUS | Status: DC
  Administered 2014-08-11: 18:00:00 via INTRAVENOUS
  Administered 2014-08-12: 75 mL/h via INTRAVENOUS

## 2014-08-11 MED ORDER — IOHEXOL 300 MG/ML  SOLN
100.0000 mL | Freq: Once | INTRAMUSCULAR | Status: AC | PRN
  Administered 2014-08-11: 100 mL via INTRAVENOUS

## 2014-08-11 MED ORDER — METRONIDAZOLE IN NACL 5-0.79 MG/ML-% IV SOLN
500.0000 mg | Freq: Three times a day (TID) | INTRAVENOUS | Status: DC
  Administered 2014-08-11 – 2014-08-13 (×5): 500 mg via INTRAVENOUS
  Filled 2014-08-11 (×6): qty 100

## 2014-08-11 MED ORDER — ALBUTEROL SULFATE (2.5 MG/3ML) 0.083% IN NEBU: 2.5000 mg | INHALATION_SOLUTION | RESPIRATORY_TRACT | Status: DC | PRN

## 2014-08-11 MED ORDER — PANTOPRAZOLE SODIUM 40 MG PO TBEC
40.0000 mg | DELAYED_RELEASE_TABLET | Freq: Every day | ORAL | Status: DC
  Administered 2014-08-11 – 2014-08-13 (×3): 40 mg via ORAL
  Filled 2014-08-11 (×4): qty 1

## 2014-08-11 MED ORDER — PRAVASTATIN SODIUM 80 MG PO TABS
80.0000 mg | ORAL_TABLET | Freq: Every day | ORAL | Status: DC
  Administered 2014-08-11 – 2014-08-12 (×2): 80 mg via ORAL
  Filled 2014-08-11 (×3): qty 1

## 2014-08-11 MED ORDER — OXYCODONE HCL 5 MG PO TABS
5.0000 mg | ORAL_TABLET | ORAL | Status: DC | PRN
  Administered 2014-08-11 – 2014-08-12 (×3): 5 mg via ORAL
  Filled 2014-08-11 (×3): qty 1

## 2014-08-11 MED ORDER — ADULT MULTIVITAMIN W/MINERALS CH
1.0000 | ORAL_TABLET | Freq: Every day | ORAL | Status: DC
  Administered 2014-08-11 – 2014-08-13 (×3): 1 via ORAL
  Filled 2014-08-11 (×3): qty 1

## 2014-08-11 MED ORDER — CIPROFLOXACIN IN D5W 400 MG/200ML IV SOLN
400.0000 mg | Freq: Two times a day (BID) | INTRAVENOUS | Status: DC
  Administered 2014-08-12 (×3): 400 mg via INTRAVENOUS
  Filled 2014-08-11 (×4): qty 200

## 2014-08-11 MED ORDER — HEPARIN SODIUM (PORCINE) 5000 UNIT/ML IJ SOLN
5000.0000 [IU] | Freq: Three times a day (TID) | INTRAMUSCULAR | Status: DC
  Administered 2014-08-11 – 2014-08-13 (×5): 5000 [IU] via SUBCUTANEOUS
  Filled 2014-08-11 (×8): qty 1

## 2014-08-11 MED ORDER — ACETAMINOPHEN 325 MG PO TABS: 650.0000 mg | ORAL_TABLET | Freq: Four times a day (QID) | ORAL | Status: DC | PRN

## 2014-08-11 MED ORDER — MORPHINE SULFATE 2 MG/ML IJ SOLN
2.0000 mg | INTRAMUSCULAR | Status: DC | PRN
  Administered 2014-08-11 – 2014-08-12 (×4): 2 mg via INTRAVENOUS
  Filled 2014-08-11 (×3): qty 1

## 2014-08-11 NOTE — H&P (Signed)
Patient Demographics  Sergio [name], is a 74 y.o. male  MRN: [id number]   DOB - 06/17/1940  Admit Date - 08/11/2014  Outpatient Primary MD for the patient is Elsie Stain, MD   With History of -  Past Medical History  Diagnosis Date  . Diverticulosis of colon 03/2005    via colonoscopy  . Hyperlipidemia 06/1995    started pravachol  . GERD (gastroesophageal reflux disease) 10/2005  . Arthritis     hand/finger OA  . Hyperglycemia   . Tubular adenoma of colon 04/29/10      Past Surgical History  Procedure Laterality Date  . Circumcision  09/1999    [name]  . Palatoplasy      tonsils and adenoids  . Septoplasty      sleep apnea  . Knee arthroscopy  04/2003    left, [name]  . Knee arthroscopy  12/2004    right, [name]  . Umbilical hernia repair  10/18/2005    [name]  . Shoulder surgery      Bilateral    in for   No chief complaint on file.  abdominal pain  HPI  Sergio [name]  is a 74 y.o. male, 74 year old male with past medical history of diverticulitis 2 in the past in 2007 and 2014, hyperlipidemia, presents to GI office with complaints of abdominal pain, reports pain started this a.m., reports its left lower quadrant, sharp, nonradiating, denies nausea, vomiting, chest pain or shortness of breath, no coffee-ground emesis, no bright red blood per rectum, no melena, had no constipation or diarrhea, GI office requested patient directly admitted for further workup for diverticulitis, patient reports this pain resembles his diverticulitis pain in the past, reports he required admission for IV Cipro and Flagyl in 2014.     Review of Systems    In addition to the HPI above,  No Fever-chills, No Headache, No changes with Vision or hearing, No problems swallowing food or Liquids, No Chest pain, Cough or Shortness of Breath,  and planes of left lower quadrantAbdominal pain, No Nausea or Vommitting, Bowel movements are regular, No Blood in stool or  Urine, No dysuria, No new skin rashes or bruises, No new joints pains-aches,  No new weakness, tingling, numbness in any extremity, No recent weight gain or loss, No polyuria, polydypsia or polyphagia, No significant Mental Stressors.  A full 10 point Review of Systems was done, except as stated above, all other Review of Systems were negative.   Social History History  Substance Use Topics  . Smoking status: Former Research scientist (life sciences)  . Smokeless tobacco: Never Used     Comment: smoked cigars rarely  . Alcohol Use: 4.2 - 9.0 oz/week    7-15 Standard drinks or equivalent per week     Comment: daily     Family History Family History  Problem Relation Age of Onset  . Diabetes Mother     glucose intolerance  . Breast cancer Mother     deceased age 20  . Diabetes Father     partial left foot amputation, burn infection  . Multiple sclerosis Sister     deceased age 48  . Colon cancer Neg Hx   . Prostate cancer Neg Hx   . Pancreatic cancer Mother      Prior to Admission medications   Medication Sig Start Date End Date Taking? Authorizing Provider  aspirin 81 MG tablet Take 81 mg by mouth daily.     Yes Historical  Provider, MD  cetirizine (ZYRTEC) 10 MG tablet Take 10 mg by mouth daily.   Yes Historical Provider, MD  Multiple Vitamin (MULTIVITAMIN) tablet Take 1 tablet by mouth daily.     Yes Historical Provider, MD  naproxen sodium (ANAPROX) 220 MG tablet Take 220 mg by mouth 2 (two) times daily as needed (pain).    Yes Historical Provider, MD  pravastatin (PRAVACHOL) 80 MG tablet TAKE ONE TABLET BY MOUTH EVERY NIGHT AT BEDTIME 09/15/13  Yes Tonia Ghent, MD    No Known Allergies  Physical Exam  Vitals  Blood pressure 116/61, pulse 73, temperature 98.1 F (36.7 C), temperature source Oral, resp. rate 16, height 6\' 1"  (1.854 m), weight 114.306 kg (252 lb), SpO2 95 %.   1. General well-nourished male lying in bed in NAD,    2. Normal affect and insight, Not Suicidal or  Homicidal, Awake Alert, Oriented X 3.  3. No F.N deficits, ALL C.Nerves Intact, Strength 5/5 all 4 extremities, Sensation intact all 4 extremities, Plantars down going.  4. Ears and Eyes appear Normal, Conjunctivae clear, PERRLA. Moist Oral Mucosa.  5. Supple Neck, No JVD, No cervical lymphadenopathy appriciated, No Carotid Bruits.  6. Symmetrical Chest wall movement, Good air movement bilaterally, CTAB.   7. RRR, No Gallops, Rubs or Murmurs, No Parasternal Heave.  8. Positive Bowel Sounds, Abdomen Soft, left lower quadrant tenderness, No organomegaly appriciated,No rebound -guarding or rigidity.  9.  No Cyanosis, Normal Skin Turgor, No Skin Rash or Bruise.  10. Good muscle tone,  joints appear normal , no effusions, Normal ROM.  11. No Palpable Lymph Nodes in Neck or Axillae    Data Review  CBC  Recent Labs Lab 08/11/14 1650  WBC 9.4  HGB 14.9  HCT 46.0  PLT 158  MCV 91.5  MCH 29.6  MCHC 32.4  RDW 13.3   ------------------------------------------------------------------------------------------------------------------  Chemistries  No results for input(s): NA, K, CL, CO2, GLUCOSE, BUN, CREATININE, CALCIUM, MG, AST, ALT, ALKPHOS, BILITOT in the last 168 hours.  Invalid input(s): GFRCGP ------------------------------------------------------------------------------------------------------------------ CrCl cannot be calculated (Patient has no serum creatinine result on file.). ------------------------------------------------------------------------------------------------------------------ No results for input(s): TSH, T4TOTAL, T3FREE, THYROIDAB in the last 72 hours.  Invalid input(s): FREET3   Coagulation profile No results for input(s): INR, PROTIME in the last 168 hours. ------------------------------------------------------------------------------------------------------------------- No results for input(s): DDIMER in the last 72  hours. -------------------------------------------------------------------------------------------------------------------  Cardiac Enzymes No results for input(s): CKMB, TROPONINI, MYOGLOBIN in the last 168 hours.  Invalid input(s): CK ------------------------------------------------------------------------------------------------------------------ Invalid input(s): POCBNP   ---------------------------------------------------------------------------------------------------------------  Urinalysis    Component Value Date/Time   BILIRUBINUR neg 06/03/2012 1638   PROTEINUR trace 06/03/2012 1638   UROBILINOGEN 0.2 06/03/2012 1638   NITRITE neg 06/03/2012 1638   LEUKOCYTESUR Negative 06/03/2012 1638    ----------------------------------------------------------------------------------------------------------------  Imaging results:   No results found.      Assessment & Plan  Principal Problem:   Abdominal pain Active Problems:   Hyperlipemia   abdominal pain - Wide differentials, but evidence that this is most likely related to diverticulitis, given his physical exam, and it resembles previous episodes of diverticulitis, will obtain basic labs including CBC, CMP, lipase level, will check CT abdomen and pelvis with IV contrast, there is evidence of diverticulitis will start on IV antibiotics. - [facility] will be following the patient - Continue with IV fluids, will keep on clear liquid diet. - When necessary pain and nausea medication.  Hyperlipidemia - Continue with statin  DVT Prophylaxis Heparin -  AM Labs Ordered, also please review Full Orders  Family Communication: Admission, patients condition and plan of care including tests being ordered have been discussed with the patient and wife who indicate understanding and agree with the plan and Code Status.  Code Status full  Likely DC to  Home when stable  Condition GUARDED    Time spent in minutes : 50  minutes    ELGERGAWY, DAWOOD M.D on 08/11/2014 at 5:07 PM  Between 7am to 7pm - Pager - [phone]  After 7pm go to [email or web address] - password [email or web address]  And look for the night coverage person covering me after hours  [facility] Office  [phone]

## 2014-08-11 NOTE — Patient Instructions (Signed)
Dr Orlinda Blalock

## 2014-08-11 NOTE — Telephone Encounter (Signed)
Pt states he is having left sided abdominal pain and a lot of bloating. States he thinks he is having a diverticulitis flare. States he has had to go to the ER recently on 2 different occasions with these symptoms. Also reports problems with constipation. Pt scheduled to see Dr. Olevia Perches today at 2:15pm. Pt aware of appt.

## 2014-08-11 NOTE — Progress Notes (Signed)
Sergio [name] 04/06/1940 [id number]  Note: This dictation was prepared with Dragon digital system. Any transcriptional errors that result from this procedure are unintentional.   History of Present Illness: This is a 74 year old white male with acute onset of left lower quadrant abdominal pain radiating to his back and to the upper abdomen, resembling prior episode of diverticulitis. We have seen him  In April 2014 for recurrent diverticulitis.[name] He had a first episode of diverticulitis in 2007. Last colonoscopy in February 2012 showed tubular adenoma adenomas and diverticulosis. In April 2014 while in [place] on business trip he developed another attack of rather acute left "left lower quadrant abdominal pain and was hospitalized overnight at [facility] and given intravenous Cipro and Flagyl CT scan  Of the abdomen confirmed 6 cm long segment of the stranding in the sigmoid colon with inflammation consistent with diverticulitis. His son had  a perforated diverticulitis necessitating surgery. Patient is rather concerned about the acute pain which seemed to be rather severe today and he was worked into my of office visit. He denies fever. He ate breakfast this morning but nothing since then. He is denying rectal bleeding    Past Medical History  Diagnosis Date  . Diverticulosis of colon 03/2005    via colonoscopy  . Hyperlipidemia 06/1995    started pravachol  . GERD (gastroesophageal reflux disease) 10/2005  . Arthritis     hand/finger OA  . Hyperglycemia   . Tubular adenoma of colon 04/29/10    Past Surgical History  Procedure Laterality Date  . Circumcision  09/1999    [name]  . Palatoplasy      tonsils and adenoids  . Septoplasty      sleep apnea  . Knee arthroscopy  04/2003    left, [name]  . Knee arthroscopy  12/2004    right, [name]  . Umbilical hernia repair  10/18/2005    [name]  . Shoulder surgery      Bilateral    No Known Allergies  Family history and  social history have been reviewed.  Review of Systems: Abdominal distention and bloating today. He denies fever. Last bowel movement 24 hours ago  The remainder of the 10 point ROS is negative except as outlined in the H&P  Physical Exam: General Appearance Well developed, in no distress, standing up unable to sit down because of the pain. Eyes  Non icteric  HEENT  Non traumatic, normocephalic  Mouth No lesion, tongue papillated, no cheilosis Neck Supple without adenopathy, thyroid not enlarged, no carotid bruits, no JVD Lungs Clear to auscultation bilaterally COR Normal S1, normal S2, regular rhythm, no murmur, quiet precordium Abdomen protuberant but soft with marked tenderness in left lower quadrant which does not radiate to his leg. Straight leg raising is positive on the left side. There is no CVA tenderness. Bowel sounds are decreased. There is no palpable mass. Right side of the abdomen is unremarkable Rectal more amount of salt Hemoccult-negative stool Extremities  No pedal edema Skin No lesions Neurological Alert and oriented x 3 Psychological Normal mood and affect  Assessment and Plan:   74 year old white male with a rather acute onset of left lower quadrant abdominal pain and  early rebound.  consistent with diverticulitis He has documentedAcute diverticulitis in 2007 and again in 2014. We will  admit him tonight and obtain CT scan of the abdomen. I have contacted the hospitalist service for admission and for initiation of the intravenous  Antibiotics  as well as pain control. Differential diagnosis isincarcerated hernia,  musculoskeletal  problem such as herniated disc although his pain does not radiate to the leg. Peptic ulcer would be another possibility    [name] 08/11/2014

## 2014-08-11 NOTE — Progress Notes (Signed)
ANTIBIOTIC CONSULT NOTE - INITIAL  Pharmacy Consult for Cipro Indication: Intra-abdominal infection  No Known Allergies  Patient Measurements: Height: 6\' 1"  (185.4 cm) Weight: 252 lb (114.306 kg) IBW/kg (Calculated) : 79.9  Vital Signs: Temp: 99.2 F (37.3 C) (06/10 2130) Temp Source: Oral (06/10 2130) BP: 122/64 mmHg (06/10 2130) Pulse Rate: 68 (06/10 2130) Intake/Output from previous day:   Intake/Output from this shift:    Labs:  Recent Labs  08/11/14 1650  WBC 9.4  HGB 14.9  PLT 158  CREATININE 0.93   Estimated Creatinine Clearance: 95.2 mL/min (by C-G formula based on Cr of 0.93). No results for input(s): VANCOTROUGH, VANCOPEAK, VANCORANDOM, GENTTROUGH, GENTPEAK, GENTRANDOM, TOBRATROUGH, TOBRAPEAK, TOBRARND, AMIKACINPEAK, AMIKACINTROU, AMIKACIN in the last 72 hours.   Microbiology: No results found for this or any previous visit (from the past 720 hour(s)).  Medical History: Past Medical History  Diagnosis Date  . Diverticulosis of colon 03/2005    via colonoscopy  . Hyperlipidemia 06/1995    started pravachol  . GERD (gastroesophageal reflux disease) 10/2005  . Arthritis     hand/finger OA  . Hyperglycemia   . Tubular adenoma of colon 04/29/10    Medications:  Anti-infectives    Start     Dose/Rate Route Frequency Ordered Stop   08/11/14 2330  metroNIDAZOLE (FLAGYL) IVPB 500 mg     500 mg 100 mL/hr over 60 Minutes Intravenous Every 8 hours 08/11/14 2245       Assessment: 75 yoM admitted 6/10 with abdominal pain and concern for diverticulitis.  He had a history of diverticulitis in 2007 and in 2014.  Metronidazole has been started and pharmacy is consulted to dose Cipro.  6/10 >> metronidazole >> 6/10 >> Cipro >>    Today, 08/11/2014:  Tmax: 99.2  WBC 9.4  Renal: SCr 0.93, CrCl ~ 95 ml/min  Goal of Therapy:  Appropriate abx dosing, eradication of infection.   Plan:   Cipro 400mg  IV q12h  Metronidazole 500mg  IV q8h per MD.  Follow up  renal fxn, culture results, and clinical course.   Gretta Arab PharmD, BCPS Pager [phone] 08/11/2014 11:10 PM

## 2014-08-12 DIAGNOSIS — E785 Hyperlipidemia, unspecified: Secondary | ICD-10-CM | POA: Diagnosis not present

## 2014-08-12 DIAGNOSIS — K5792 Diverticulitis of intestine, part unspecified, without perforation or abscess without bleeding: Secondary | ICD-10-CM | POA: Diagnosis not present

## 2014-08-12 DIAGNOSIS — K5900 Constipation, unspecified: Secondary | ICD-10-CM | POA: Diagnosis not present

## 2014-08-12 DIAGNOSIS — K5732 Diverticulitis of large intestine without perforation or abscess without bleeding: Secondary | ICD-10-CM | POA: Diagnosis not present

## 2014-08-12 DIAGNOSIS — R1032 Left lower quadrant pain: Secondary | ICD-10-CM | POA: Diagnosis not present

## 2014-08-12 DIAGNOSIS — K5730 Diverticulosis of large intestine without perforation or abscess without bleeding: Secondary | ICD-10-CM

## 2014-08-12 LAB — CBC
HCT: 41.6 % (ref 39.0–52.0)
HEMOGLOBIN: 13.3 g/dL (ref 13.0–17.0)
MCH: 29.1 pg (ref 26.0–34.0)
MCHC: 32 g/dL (ref 30.0–36.0)
MCV: 91 fL (ref 78.0–100.0)
Platelets: 134 10*3/uL — ABNORMAL LOW (ref 150–400)
RBC: 4.57 MIL/uL (ref 4.22–5.81)
RDW: 13.4 % (ref 11.5–15.5)
WBC: 7.4 10*3/uL (ref 4.0–10.5)

## 2014-08-12 LAB — BASIC METABOLIC PANEL
ANION GAP: 6 (ref 5–15)
BUN: 14 mg/dL (ref 6–20)
CALCIUM: 8.6 mg/dL — AB (ref 8.9–10.3)
CO2: 29 mmol/L (ref 22–32)
Chloride: 103 mmol/L (ref 101–111)
Creatinine, Ser: 0.9 mg/dL (ref 0.61–1.24)
GFR calc non Af Amer: >60 mL/min (ref >60)
GLUCOSE: 115 mg/dL — AB (ref 65–99)
Potassium: 4.2 mmol/L (ref 3.5–5.1)
Sodium: 138 mmol/L (ref 135–145)

## 2014-08-12 MED ORDER — SENNOSIDES-DOCUSATE SODIUM 8.6-50 MG PO TABS
1.0000 | ORAL_TABLET | Freq: Two times a day (BID) | ORAL | Status: DC
  Administered 2014-08-12 – 2014-08-13 (×3): 1 via ORAL
  Filled 2014-08-12 (×5): qty 1

## 2014-08-12 MED ORDER — POLYETHYLENE GLYCOL 3350 17 G PO PACK: 17.0000 g | PACK | Freq: Every day | ORAL | Status: DC

## 2014-08-12 MED ORDER — POLYETHYLENE GLYCOL 3350 17 G PO PACK
17.0000 g | PACK | Freq: Every day | ORAL | Status: DC
  Administered 2014-08-12: 17 g via ORAL
  Filled 2014-08-12 (×2): qty 1

## 2014-08-12 MED ORDER — BISACODYL 5 MG PO TBEC
10.0000 mg | DELAYED_RELEASE_TABLET | Freq: Once | ORAL | Status: AC
  Administered 2014-08-12: 10 mg via ORAL
  Filled 2014-08-12: qty 2

## 2014-08-12 MED ORDER — POLYETHYLENE GLYCOL 3350 17 G PO PACK: 17.0000 g | PACK | Freq: Two times a day (BID) | ORAL | Status: DC

## 2014-08-12 MED ORDER — MORPHINE SULFATE 4 MG/ML IJ SOLN
3.0000 mg | INTRAMUSCULAR | Status: DC | PRN
  Administered 2014-08-12: 3 mg via INTRAVENOUS
  Filled 2014-08-12: qty 1

## 2014-08-12 NOTE — Progress Notes (Signed)
Patient ID: Sergio [name], male   DOB: 1941/02/11, 74 y.o.   MRN: [id number]    Progress Note   Subjective  Pt was seen in office yesterday by Dr. Olevia Perches, and admitted to [facility] with acute diverticulitis - we are asked to follow up today  CT shows acute sigmoid diverticulitis -uncomplicated. He is afebrile and WBC on admit normal.  Pt says pain was intense yesterday and oral pain meds made no difference- he says pain is about the same 6/10 but IV pain meds helping-abdomen feels tight, distended to him and no flatus or BM's This is his 3rd episode of Diverticulitis     Objective   Vital signs in last 24 hours: Temp:  [98.1 F (36.7 C)-99.2 F (37.3 C)] 98.8 F (37.1 C) (06/11 0530) Pulse Rate:  [54-73] 65 (06/11 0530) Resp:  [16-18] 18 (06/11 0530) BP: (115-128)/(61-64) 115/61 mmHg (06/11 0530) SpO2:  [94 %-96 %] 94 % (06/11 0530) Weight:  [252 lb (114.306 kg)] 252 lb (114.306 kg) (06/10 1632) Last BM Date: 08/10/14 General:   WM in NAD,up in chair,pleasant Heart:  Regular rate and rhythm; no murmurs Lungs: Respirations even and unlabored, lungs CTA bilaterally Abdomen:  Soft, tender lower abdomen and LLQ with guarding, no rebound, Normal bowel sounds. Extremities:  Without edema. Neurologic:  Alert and oriented,  grossly normal neurologically. Psych:  Cooperative. Normal mood and affect.  Intake/Output from previous day: 06/10 0701 - 06/11 0700 In: 1298.8 [P.O.:240; I.V.:758.8; IV Piggyback:300] Out: 325 [Urine:325] Intake/Output this shift:    Lab Results:  Recent Labs  08/11/14 1650 08/12/14 0518  WBC 9.4 7.4  HGB 14.9 13.3  HCT 46.0 41.6  PLT 158 134*   BMET  Recent Labs  08/11/14 1650 08/12/14 0518  NA 138 138  K 4.4 4.2  CL 104 103  CO2 26 29  GLUCOSE 101* 115*  BUN 18 14  CREATININE 0.93 0.90  CALCIUM 9.2 8.6*   LFT  Recent Labs  08/11/14 1650  PROT 7.5  ALBUMIN 4.0  AST 23  ALT 26  ALKPHOS 89  BILITOT 0.8   PT/INR No  results for input(s): LABPROT, INR in the last 72 hours.  Studies/Results: Ct Abdomen Pelvis W Contrast  08/11/2014   CLINICAL DATA:  Mild left lower quadrant abdominal pain, onset this morning at 7:30. Pain acutely worsened at 9:30.  EXAM: CT ABDOMEN AND PELVIS WITH CONTRAST  TECHNIQUE: Multidetector CT imaging of the abdomen and pelvis was performed using the standard protocol following bolus administration of intravenous contrast.  CONTRAST:  30mL OMNIPAQUE IOHEXOL 300 MG/ML SOLN, 138mL OMNIPAQUE IOHEXOL 300 MG/ML SOLN  COMPARISON:  10/18/2005  FINDINGS: There are acute inflammatory changes surrounding a diverticulum of the proximal sigmoid colon with appearances typical of acute diverticulitis. There is no abscess. There is no extraluminal air. There is no bowel obstruction.  There are normal appearances of the liver, spleen, pancreas and adrenals. There are multiple peripelvic renal cysts bilaterally, left greater than right, unchanged.  The stomach and small bowel are unremarkable.  There is no significant abnormality in the lower chest. There is no significant musculoskeletal abnormality.  IMPRESSION: Acute proximal sigmoid diverticulitis.  No abscess.   Electronically Signed   By: Andreas Newport M.D.   On: 08/11/2014 21:19      Assessment / Plan:    #1 74 yo male with acute sigmoid diverticulitis - uncomplicated - pt with significant pain-suspect secondary to inflammation /irritation of mesentery Continue IV Cipro/Flagyl Full liquid  diet Add Miralax daily Continue IV pain meds thru am tomorrow - if he is improved and can mange at home with oral analgesic may be able to be discharged tomorrow.  Principal Problem:   Abdominal pain Active Problems:   Hyperlipemia    LOS: 1 day   Amy Esterwood  08/12/2014, 8:52 AM     Attending physician's note   I have taken an interval history, reviewed the chart and examined the patient. I agree with the Advanced Practitioner's note, impression  and recommendations. Acute sigmoid diverticulitis. Continue IV antibiotics and liquid diet. Attempt to improve gas, constipation with Miralax. Discharge when pain, tenderness have improved and pain can be controlled with PO pain medication.  Pricilla Riffle. Fuller Plan, MD [facility]

## 2014-08-12 NOTE — Progress Notes (Signed)
Patient Demographics  Sergio [name], is a 74 y.o. male, DOB - 1941/02/20, LDJ:[id number]  Admit date - 08/11/2014   Admitting Physician Sergio Patricia, MD  Outpatient Primary MD for the patient is Sergio Stain, MD  LOS - 1   No chief complaint on file.      Admission HPI/Brief narrative: Presents with abdominal pain, CT abdomen pelvis showing evidence of uncomplicated acute diverticulitis, a febrile, no leukocytosis, on IV Cipro and Flagyl.  Subjective:   [name] today has, No headache, No chest pain,  abdominal pain improving- No Nausea, No new weakness tingling or numbness, No Cough - SOB.   Assessment & Plan    Principal Problem:   Abdominal pain Active Problems:   Hyperlipemia   Acute diverticulitis  Acute diverticulitis: - CT abdomen showing evidence of acute diverticulitis, uncomplicated, a febrile, leukocytosis, pain is improving, continue with IV fluids, IV Cipro and Flagyl, will advance to full liquid diet, GI follow-up appreciated.  Hyperlipidemia - Continue with statin  Code Status: Full  Family Communication: none at bedside  Disposition Plan: Home once stable   Procedures  None   Consults   GI   Medications  Scheduled Meds: . ciprofloxacin  400 mg Intravenous Q12H  . heparin  5,000 Units Subcutaneous 3 times per day  . metronidazole  500 mg Intravenous Q8H  . multivitamin with minerals  1 tablet Oral Daily  . pantoprazole  40 mg Oral Daily  . polyethylene glycol  17 g Oral Daily  . pravastatin  80 mg Oral q1800  . senna-docusate  1 tablet Oral BID   Continuous Infusions: . sodium chloride 75 mL/hr (08/12/14 0732)   PRN Meds:.acetaminophen **OR** acetaminophen, albuterol, morphine injection, oxyCODONE  DVT Prophylaxis  - Heparin -  Lab Results  Component Value Date   PLT 134* 08/12/2014    Antibiotics    Anti-infectives    Start      Dose/Rate Route Frequency Ordered Stop   08/11/14 2330  metroNIDAZOLE (FLAGYL) IVPB 500 mg     500 mg 100 mL/hr over 60 Minutes Intravenous Every 8 hours 08/11/14 2245     08/11/14 2330  ciprofloxacin (CIPRO) IVPB 400 mg     400 mg 200 mL/hr over 60 Minutes Intravenous Every 12 hours 08/11/14 2311            Objective:   Filed Vitals:   08/11/14 1632 08/11/14 2130 08/12/14 0530 08/12/14 1415  BP: 116/61 122/64 115/61 113/68  Pulse: 73 68 65 63  Temp: 98.1 F (36.7 C) 99.2 F (37.3 C) 98.8 F (37.1 C) 98.4 F (36.9 C)  TempSrc: Oral Oral Oral Oral  Resp: 16 18 18 18   Height: 6\' 1"  (1.854 m)     Weight: 114.306 kg (252 lb)     SpO2: 95% 96% 94% 97%    Wt Readings from Last 3 Encounters:  08/11/14 114.306 kg (252 lb)  08/11/14 114.306 kg (252 lb)  08/10/14 115.214 kg (254 lb)     Intake/Output Summary (Last 24 hours) at 08/12/14 1452 Last data filed at 08/12/14 1300  Gross per 24 hour  Intake 1538.75 ml  Output    325 ml  Net 1213.75 ml     Physical Exam  Awake Alert, Oriented X 3, No new F.N deficits, Normal affect [place].AT,PERRAL Supple Neck,No JVD, No cervical lymphadenopathy appriciated.  Symmetrical Chest wall movement, Good air movement bilaterally, CTAB RRR,No Gallops,Rubs or new Murmurs, No Parasternal Heave +ve B.Sounds, Abd Soft, minimal tenderness in left lower quadrant., No organomegaly appriciated, No rebound - guarding or rigidity. No Cyanosis, Clubbing or edema, No new Rash or bruise    Data Review   Micro Results No results found for this or any previous visit (from the past 240 hour(s)).  Radiology Reports Ct Abdomen Pelvis W Contrast  08/11/2014   CLINICAL DATA:  Mild left lower quadrant abdominal pain, onset this morning at 7:30. Pain acutely worsened at 9:30.  EXAM: CT ABDOMEN AND PELVIS WITH CONTRAST  TECHNIQUE: Multidetector CT imaging of the abdomen and pelvis was performed using the standard protocol following bolus administration of  intravenous contrast.  CONTRAST:  21mL OMNIPAQUE IOHEXOL 300 MG/ML SOLN, 179mL OMNIPAQUE IOHEXOL 300 MG/ML SOLN  COMPARISON:  10/18/2005  FINDINGS: There are acute inflammatory changes surrounding a diverticulum of the proximal sigmoid colon with appearances typical of acute diverticulitis. There is no abscess. There is no extraluminal air. There is no bowel obstruction.  There are normal appearances of the liver, spleen, pancreas and adrenals. There are multiple peripelvic renal cysts bilaterally, left greater than right, unchanged.  The stomach and small bowel are unremarkable.  There is no significant abnormality in the lower chest. There is no significant musculoskeletal abnormality.  IMPRESSION: Acute proximal sigmoid diverticulitis.  No abscess.   Electronically Signed   By: Andreas Newport M.D.   On: 08/11/2014 21:19     CBC  Recent Labs Lab 08/11/14 1650 08/12/14 0518  WBC 9.4 7.4  HGB 14.9 13.3  HCT 46.0 41.6  PLT 158 134*  MCV 91.5 91.0  MCH 29.6 29.1  MCHC 32.4 32.0  RDW 13.3 13.4    Chemistries   Recent Labs Lab 08/11/14 1650 08/12/14 0518  NA 138 138  K 4.4 4.2  CL 104 103  CO2 26 29  GLUCOSE 101* 115*  BUN 18 14  CREATININE 0.93 0.90  CALCIUM 9.2 8.6*  AST 23  --   ALT 26  --   ALKPHOS 89  --   BILITOT 0.8  --    ------------------------------------------------------------------------------------------------------------------ estimated creatinine clearance is 98.3 mL/min (by C-G formula based on Cr of 0.9). ------------------------------------------------------------------------------------------------------------------ No results for input(s): HGBA1C in the last 72 hours. ------------------------------------------------------------------------------------------------------------------ No results for input(s): CHOL, HDL, LDLCALC, TRIG, CHOLHDL, LDLDIRECT in the last 72  hours. ------------------------------------------------------------------------------------------------------------------ No results for input(s): TSH, T4TOTAL, T3FREE, THYROIDAB in the last 72 hours.  Invalid input(s): FREET3 ------------------------------------------------------------------------------------------------------------------ No results for input(s): VITAMINB12, FOLATE, FERRITIN, TIBC, IRON, RETICCTPCT in the last 72 hours.  Coagulation profile No results for input(s): INR, PROTIME in the last 168 hours.  No results for input(s): DDIMER in the last 72 hours.  Cardiac Enzymes No results for input(s): CKMB, TROPONINI, MYOGLOBIN in the last 168 hours.  Invalid input(s): CK ------------------------------------------------------------------------------------------------------------------ Invalid input(s): POCBNP     Time Spent in minutes   20 minutes   ELGERGAWY, DAWOOD M.D on 08/12/2014 at 2:52 PM  Between 7am to 7pm - Pager - [phone]  After 7pm go to [email or web address] - password [facility]  [facility]   Office  [phone]

## 2014-08-13 DIAGNOSIS — K5792 Diverticulitis of intestine, part unspecified, without perforation or abscess without bleeding: Secondary | ICD-10-CM | POA: Diagnosis not present

## 2014-08-13 DIAGNOSIS — E785 Hyperlipidemia, unspecified: Secondary | ICD-10-CM | POA: Diagnosis not present

## 2014-08-13 DIAGNOSIS — K5732 Diverticulitis of large intestine without perforation or abscess without bleeding: Secondary | ICD-10-CM | POA: Diagnosis not present

## 2014-08-13 DIAGNOSIS — K5900 Constipation, unspecified: Secondary | ICD-10-CM

## 2014-08-13 MED ORDER — OXYCODONE-ACETAMINOPHEN 5-325 MG PO TABS: 1.0000 | ORAL_TABLET | ORAL | Status: DC | PRN

## 2014-08-13 MED ORDER — BISACODYL 10 MG RE SUPP
10.0000 mg | RECTAL | Status: AC
  Administered 2014-08-13: 10 mg via RECTAL
  Filled 2014-08-13: qty 1

## 2014-08-13 MED ORDER — METRONIDAZOLE 500 MG PO TABS: 500.0000 mg | ORAL_TABLET | Freq: Three times a day (TID) | ORAL | Status: AC

## 2014-08-13 MED ORDER — SENNA 8.6 MG PO TABS: 1.0000 | ORAL_TABLET | Freq: Every day | ORAL | Status: DC

## 2014-08-13 MED ORDER — DOCUSATE SODIUM 100 MG PO CAPS: 100.0000 mg | ORAL_CAPSULE | Freq: Two times a day (BID) | ORAL | Status: DC

## 2014-08-13 MED ORDER — CIPROFLOXACIN HCL 500 MG PO TABS: 500.0000 mg | ORAL_TABLET | Freq: Two times a day (BID) | ORAL | Status: AC

## 2014-08-13 MED ORDER — POLYETHYLENE GLYCOL 3350 17 G PO PACK
17.0000 g | PACK | Freq: Two times a day (BID) | ORAL | Status: DC
  Administered 2014-08-13: 17 g via ORAL
  Filled 2014-08-13: qty 1

## 2014-08-13 MED ORDER — POLYETHYLENE GLYCOL 3350 17 G PO PACK: 17.0000 g | PACK | Freq: Two times a day (BID) | ORAL | Status: AC

## 2014-08-13 NOTE — Discharge Instructions (Signed)
Follow with Primary MD Elsie Stain, MD in 7 days   Get CBC, CMP, 2 view Chest X ray checked  by Primary MD next visit.    Activity: As tolerated with Full fall precautions use walker/cane & assistance as needed   Disposition Home    Diet: Heart Healthy, soft diet , with feeding assistance and aspiration precautions.  For Heart failure patients - Check your Weight same time everyday, if you gain over 2 pounds, or you develop in leg swelling, experience more shortness of breath or chest pain, call your Primary MD immediately. Follow Cardiac Low Salt Diet and 1.5 lit/day fluid restriction.   On your next visit with your primary care physician please Get Medicines reviewed and adjusted.   Please request your Prim.MD to go over all Hospital Tests and Procedure/Radiological results at the follow up, please get all Hospital records sent to your Prim MD by signing hospital release before you go home.   If you experience worsening of your admission symptoms, develop shortness of breath, life threatening emergency, suicidal or homicidal thoughts you must seek medical attention immediately by calling 911 or calling your MD immediately  if symptoms less severe.  You Must read complete instructions/literature along with all the possible adverse reactions/side effects for all the Medicines you take and that have been prescribed to you. Take any new Medicines after you have completely understood and accpet all the possible adverse reactions/side effects.   Do not drive, operating heavy machinery, perform activities at heights, swimming or participation in water activities or provide baby sitting services if your were admitted for syncope or siezures until you have seen by Primary MD or a Neurologist and advised to do so again.  Do not drive when taking Pain medications.    Do not take more than prescribed Pain, Sleep and Anxiety Medications  Special Instructions: If you have smoked or chewed  Tobacco  in the last 2 yrs please stop smoking, stop any regular Alcohol  and or any Recreational drug use.  Wear Seat belts while driving.   Please note  You were cared for by a hospitalist during your hospital stay. If you have any questions about your discharge medications or the care you received while you were in the hospital after you are discharged, you can call the unit and asked to speak with the hospitalist on call if the hospitalist that took care of you is not available. Once you are discharged, your primary care physician will handle any further medical issues. Please note that NO REFILLS for any discharge medications will be authorized once you are discharged, as it is imperative that you return to your primary care physician (or establish a relationship with a primary care physician if you do not have one) for your aftercare needs so that they can reassess your need for medications and monitor your lab values.

## 2014-08-13 NOTE — Progress Notes (Signed)
Patient ID: Sergio [name], male   DOB: Jan 19, 1941, 74 y.o.   MRN: [id number]    Progress Note   Subjective  Feels much better...has not had any pain meds since last night- worried as still hasn't had a BM   Objective   Vital signs in last 24 hours: Temp:  [98.4 F (36.9 C)-99.6 F (37.6 C)] 98.7 F (37.1 C) (06/12 0533) Pulse Rate:  [63-68] 65 (06/12 0533) Resp:  [18] 18 (06/12 0533) BP: (100-115)/(46-73) 100/46 mmHg (06/12 0533) SpO2:  [95 %-97 %] 95 % (06/12 0533) Last BM Date: 08/10/14 General:    white male in NAD Heart:  Regular rate and rhythm; no murmurs Lungs: Respirations even and unlabored, lungs CTA bilaterally Abdomen:  Soft, minimally tender LLQ no guarding, BS+. Extremities:  Without edema. Neurologic:  Alert and oriented,  grossly normal neurologically. Psych:  Cooperative. Normal mood and affect.  Intake/Output from previous day: 06/11 0701 - 06/12 0700 In: 7062 [P.O.:840; I.V.:1800; IV Piggyback:700] Out: -  Intake/Output this shift:    Lab Results:  Recent Labs  08/11/14 1650 08/12/14 0518  WBC 9.4 7.4  HGB 14.9 13.3  HCT 46.0 41.6  PLT 158 134*   BMET  Recent Labs  08/11/14 1650 08/12/14 0518  NA 138 138  K 4.4 4.2  CL 104 103  CO2 26 29  GLUCOSE 101* 115*  BUN 18 14  CREATININE 0.93 0.90  CALCIUM 9.2 8.6*   LFT  Recent Labs  08/11/14 1650  PROT 7.5  ALBUMIN 4.0  AST 23  ALT 26  ALKPHOS 89  BILITOT 0.8   PT/INR No results for input(s): LABPROT, INR in the last 72 hours.  Studies/Results: Ct Abdomen Pelvis W Contrast  08/11/2014   CLINICAL DATA:  Mild left lower quadrant abdominal pain, onset this morning at 7:30. Pain acutely worsened at 9:30.  EXAM: CT ABDOMEN AND PELVIS WITH CONTRAST  TECHNIQUE: Multidetector CT imaging of the abdomen and pelvis was performed using the standard protocol following bolus administration of intravenous contrast.  CONTRAST:  62mL OMNIPAQUE IOHEXOL 300 MG/ML SOLN, 160mL OMNIPAQUE IOHEXOL 300  MG/ML SOLN  COMPARISON:  10/18/2005  FINDINGS: There are acute inflammatory changes surrounding a diverticulum of the proximal sigmoid colon with appearances typical of acute diverticulitis. There is no abscess. There is no extraluminal air. There is no bowel obstruction.  There are normal appearances of the liver, spleen, pancreas and adrenals. There are multiple peripelvic renal cysts bilaterally, left greater than right, unchanged.  The stomach and small bowel are unremarkable.  There is no significant abnormality in the lower chest. There is no significant musculoskeletal abnormality.  IMPRESSION: Acute proximal sigmoid diverticulitis.  No abscess.   Electronically Signed   By: Andreas Newport M.D.   On: 08/11/2014 21:19       Assessment / Plan:   #1 74 yo male with recurrent acute sigmoid diverticulitis- much improved. Ok to discharge home today to complete 14 days Cipro and Flagyl total Will arrange office follow up with Dr. Olevia Perches- to discuss surgical referral Dulcolax supp this am, then BID Miralax at home until bowels get back to baseline.  Principal Problem:   Abdominal pain Active Problems:   Hyperlipemia   Acute diverticulitis    LOS: 2 days   Amy Esterwood  08/13/2014, 8:49 AM     Attending physician's note   I have taken an interval history, reviewed the chart and examined the patient. I agree with the Advanced Practitioner's note, impression and  recommendations. Acute diverticulitis, improving. OK for PO antibiotics and outpatient mgmt. Continue Miralax bid prn as outpatient. Follow up with Dr. Cristal Generous and Dr. Orlinda Blalock. GI signing off.   Pricilla Riffle. Fuller Plan, MD [facility]

## 2014-08-13 NOTE — Discharge Summary (Signed)
Sergio [name], is a 74 y.o. male  DOB 02/08/1941  MRN [id number].  Admission date:  08/11/2014  Admitting Physician  Albertine Patricia, MD  Discharge Date:  08/13/2014   Primary MD  Elsie Stain, MD  Recommendations for primary care physician for things to follow:  - Check CBC, BMP during next visit   Admission Diagnosis  Left lower quadrant pain   Discharge Diagnosis  Left lower quadrant pain   Principal Problem:   Abdominal pain Active Problems:   Hyperlipemia   Acute diverticulitis      Past Medical History  Diagnosis Date  . Diverticulosis of colon 03/2005    via colonoscopy  . Hyperlipidemia 06/1995    started pravachol  . GERD (gastroesophageal reflux disease) 10/2005  . Arthritis     hand/finger OA  . Hyperglycemia   . Tubular adenoma of colon 04/29/10    Past Surgical History  Procedure Laterality Date  . Circumcision  09/1999    [name]  . Palatoplasy      tonsils and adenoids  . Septoplasty      sleep apnea  . Knee arthroscopy  04/2003    left, [name]  . Knee arthroscopy  12/2004    right, [name]  . Umbilical hernia repair  10/18/2005    [name]  . Shoulder surgery      Bilateral       History of present illness and  Hospital Course:     Kindly see H&P for history of present illness and admission details, please review complete Labs, Consult reports and Test reports for all details in brief  HPI  from the history and physical done on the day of admission  Blaiden [name] is a 74 y.o. male, 74 year old male with past medical history of diverticulitis 2 in the past in 2007 and 2014, hyperlipidemia, presents to GI office with complaints of abdominal pain, reports pain started this a.m., reports its left lower quadrant, sharp, nonradiating, denies nausea, vomiting, chest pain or shortness of breath, no coffee-ground emesis, no bright red blood per rectum,  no melena, had no constipation or diarrhea, GI office requested patient directly admitted for further workup for diverticulitis, patient reports this pain resembles his diverticulitis pain in the past, reports he required admission for IV Cipro and Flagyl in 2014.  Hospital Course   Acute diverticulitis: - CT abdomen and pelvis showing evidence of acute diverticulitis, uncomplicated, afebrile, no leukocytosis, pain resolved, treated with IV Cipro and Flagyl, will discharge on oral Cipro and Flagyl to finish total of 14 days, to follow with GI as an outpatient. - Continue with good bowel regimen at home.  Hyperlipidemia - Continue with statin    Discharge Condition:  Stable   Follow UP  Follow-up Information    Schedule an appointment as soon as possible for a visit with Elsie Stain, MD.   Specialty:  Family Medicine   Why:  Post hospitalization follow-up   Contact information:   [address]  [place] [address] [phone]       Follow up with [name], MD.   Specialty:  Gastroenterology   Why:  Office will call you with an appointment   Contact information:   [address] [place] [address] [phone]         Discharge Instructions  and  Discharge Medications         Discharge Instructions    Discharge instructions    Complete by:  As directed   Follow with Primary MD Elsie Stain, MD in 7 days   Get CBC, CMP, 2 view Chest X ray checked  by Primary MD next visit.    Activity: As tolerated with Full fall precautions use walker/cane & assistance as needed   Disposition Home    Diet: Heart Healthy ,soft , with feeding assistance and aspiration precautions.  For Heart failure patients - Check your Weight same time everyday, if you gain over 2 pounds, or you develop in leg swelling, experience more shortness of breath or chest pain, call your Primary MD immediately. Follow Cardiac Low Salt Diet and 1.5 lit/day fluid  restriction.   On your next visit with your primary care physician please Get Medicines reviewed and adjusted.   Please request your Prim.MD to go over all Hospital Tests and Procedure/Radiological results at the follow up, please get all Hospital records sent to your Prim MD by signing hospital release before you go home.   If you experience worsening of your admission symptoms, develop shortness of breath, life threatening emergency, suicidal or homicidal thoughts you must seek medical attention immediately by calling 911 or calling your MD immediately  if symptoms less severe.  You Must read complete instructions/literature along with all the possible adverse reactions/side effects for all the Medicines you take and that have been prescribed to you. Take any new Medicines after you have completely understood and accpet all the possible adverse reactions/side effects.   Do not drive, operating heavy machinery, perform activities at heights, swimming or participation in water activities or provide baby sitting services if your were admitted for syncope or siezures until you have seen by Primary MD or a Neurologist and advised to do so again.  Do not drive when taking Pain medications.    Do not take more than prescribed Pain, Sleep and Anxiety Medications  Special Instructions: If you have smoked or chewed Tobacco  in the last 2 yrs please stop smoking, stop any regular Alcohol  and or any Recreational drug use.  Wear Seat belts while driving.   Please note  You were cared for by a hospitalist during your hospital stay. If you have any questions about your discharge medications or the care you received while you were in the hospital after you are discharged, you can call the unit and asked to speak with the hospitalist on call if the hospitalist that took care of you is not available. Once you are discharged, your primary care physician will handle any further medical issues. Please note  that NO REFILLS for any discharge medications will be authorized once you are discharged, as it is imperative that you return to your primary care physician (or establish a relationship with a primary care physician if you do not have one) for your aftercare needs so that they can reassess your need for medications and monitor your lab values.     Increase activity slowly    Complete by:  As directed  Medication List    STOP taking these medications        naproxen sodium 220 MG tablet  Commonly known as:  ANAPROX      TAKE these medications        aspirin 81 MG tablet  Take 81 mg by mouth daily.     cetirizine 10 MG tablet  Commonly known as:  ZYRTEC  Take 10 mg by mouth daily.     ciprofloxacin 500 MG tablet  Commonly known as:  CIPRO  Take 1 tablet (500 mg total) by mouth 2 (two) times daily.     docusate sodium 100 MG capsule  Commonly known as:  COLACE  Take 1 capsule (100 mg total) by mouth 2 (two) times daily.     metroNIDAZOLE 500 MG tablet  Commonly known as:  FLAGYL  Take 1 tablet (500 mg total) by mouth 3 (three) times daily.     multivitamin tablet  Take 1 tablet by mouth daily.     oxyCODONE-acetaminophen 5-325 MG per tablet  Commonly known as:  ROXICET  Take 1 tablet by mouth every 4 (four) hours as needed.     polyethylene glycol packet  Commonly known as:  MIRALAX  Take 17 g by mouth 2 (two) times daily.     pravastatin 80 MG tablet  Commonly known as:  PRAVACHOL  TAKE ONE TABLET BY MOUTH EVERY NIGHT AT BEDTIME     senna 8.6 MG Tabs tablet  Commonly known as:  SENOKOT  Take 1 tablet (8.6 mg total) by mouth daily.          Diet and Activity recommendation: See Discharge Instructions above   Consults obtained -  GI   Major procedures and Radiology Reports - PLEASE review detailed and final reports for all details, in brief -      Ct Abdomen Pelvis W Contrast  08/11/2014   CLINICAL DATA:  Mild left lower quadrant  abdominal pain, onset this morning at 7:30. Pain acutely worsened at 9:30.  EXAM: CT ABDOMEN AND PELVIS WITH CONTRAST  TECHNIQUE: Multidetector CT imaging of the abdomen and pelvis was performed using the standard protocol following bolus administration of intravenous contrast.  CONTRAST:  40mL OMNIPAQUE IOHEXOL 300 MG/ML SOLN, 150mL OMNIPAQUE IOHEXOL 300 MG/ML SOLN  COMPARISON:  10/18/2005  FINDINGS: There are acute inflammatory changes surrounding a diverticulum of the proximal sigmoid colon with appearances typical of acute diverticulitis. There is no abscess. There is no extraluminal air. There is no bowel obstruction.  There are normal appearances of the liver, spleen, pancreas and adrenals. There are multiple peripelvic renal cysts bilaterally, left greater than right, unchanged.  The stomach and small bowel are unremarkable.  There is no significant abnormality in the lower chest. There is no significant musculoskeletal abnormality.  IMPRESSION: Acute proximal sigmoid diverticulitis.  No abscess.   Electronically Signed   By: Andreas Newport M.D.   On: 08/11/2014 21:19    Micro Results    No results found for this or any previous visit (from the past 240 hour(s)).     Today   Subjective:   [name] today has no headache,no chest abdominal pain,no new weakness tingling or numbness, feels much better wants to go home today. Reports no bowel movement yet.  Objective:   Blood pressure 100/46, pulse 65, temperature 98.7 F (37.1 C), temperature source Oral, resp. rate 18, height 6\' 1"  (1.854 m), weight 114.306 kg (252 lb), SpO2 95 %.   Intake/Output Summary (Last  24 hours) at 08/13/14 0938 Last data filed at 08/13/14 0639  Gross per 24 hour  Intake   3340 ml  Output      0 ml  Net   3340 ml    Exam Awake Alert, Oriented x 3, No new F.N deficits, Normal affect [place].AT,PERRAL Supple Neck,No JVD, No cervical lymphadenopathy appriciated.  Symmetrical Chest wall movement, Good  air movement bilaterally, CTAB RRR,No Gallops,Rubs or new Murmurs, No Parasternal Heave +ve B.Sounds, Abd Soft, left lower quadrant tenderness almost resolved. No organomegaly appriciated, No rebound -guarding or rigidity. No Cyanosis, Clubbing or edema, No new Rash or bruise  Data Review   CBC w Diff:  Lab Results  Component Value Date   WBC 7.4 08/12/2014   HGB 13.3 08/12/2014   HGB 15.3 08/06/2011   HCT 41.6 08/12/2014   HCT 46.3 08/06/2011   PLT 134* 08/12/2014   LYMPHOPCT 8.3* 06/03/2012   MONOPCT 9.0 06/03/2012   EOSPCT 0.3 06/03/2012   BASOPCT 0.3 06/03/2012    CMP:  Lab Results  Component Value Date   NA 138 08/12/2014   K 4.2 08/12/2014   CL 103 08/12/2014   CO2 29 08/12/2014   BUN 14 08/12/2014   CREATININE 0.90 08/12/2014   PROT 7.5 08/11/2014   ALBUMIN 4.0 08/11/2014   BILITOT 0.8 08/11/2014   ALKPHOS 89 08/11/2014   AST 23 08/11/2014   ALT 26 08/11/2014  .   Total Time in preparing paper work, data evaluation and todays exam - 35 minutes  Naiomy Watters M.D on 08/13/2014 at 9:38 AM  [facility]   Office  [phone]

## 2014-08-14 ENCOUNTER — Encounter: Payer: Self-pay | Admitting: *Deleted

## 2014-08-21 ENCOUNTER — Ambulatory Visit (INDEPENDENT_AMBULATORY_CARE_PROVIDER_SITE_OTHER): Payer: PPO | Admitting: Family Medicine

## 2014-08-21 ENCOUNTER — Encounter: Payer: Self-pay | Admitting: Family Medicine

## 2014-08-21 VITALS — BP 102/62 | HR 66 | Temp 98.5°F | Wt 250.0 lb

## 2014-08-21 DIAGNOSIS — M79646 Pain in unspecified finger(s): Secondary | ICD-10-CM

## 2014-08-21 DIAGNOSIS — K5792 Diverticulitis of intestine, part unspecified, without perforation or abscess without bleeding: Secondary | ICD-10-CM | POA: Diagnosis not present

## 2014-08-21 LAB — CBC WITH DIFFERENTIAL/PLATELET
BASOS PCT: 0.3 % (ref 0.0–3.0)
Basophils Absolute: 0 10*3/uL (ref 0.0–0.1)
Eosinophils Absolute: 0.1 10*3/uL (ref 0.0–0.7)
Eosinophils Relative: 1.5 % (ref 0.0–5.0)
HEMATOCRIT: 42.7 % (ref 39.0–52.0)
Hemoglobin: 13.9 g/dL (ref 13.0–17.0)
Lymphocytes Relative: 24.6 % (ref 12.0–46.0)
Lymphs Abs: 1.4 10*3/uL (ref 0.7–4.0)
MCHC: 32.6 g/dL (ref 30.0–36.0)
MCV: 89.4 fl (ref 78.0–100.0)
MONOS PCT: 9.1 % (ref 3.0–12.0)
Monocytes Absolute: 0.5 10*3/uL (ref 0.1–1.0)
NEUTROS ABS: 3.7 10*3/uL (ref 1.4–7.7)
Neutrophils Relative %: 64.5 % (ref 43.0–77.0)
Platelets: 207 10*3/uL (ref 150.0–400.0)
RBC: 4.78 Mil/uL (ref 4.22–5.81)
RDW: 13.5 % (ref 11.5–15.5)
WBC: 5.7 10*3/uL (ref 4.0–10.5)

## 2014-08-21 LAB — BASIC METABOLIC PANEL
BUN: 16 mg/dL (ref 6–23)
CALCIUM: 9.1 mg/dL (ref 8.4–10.5)
CO2: 27 meq/L (ref 19–32)
Chloride: 105 mEq/L (ref 96–112)
Creatinine, Ser: 1.02 mg/dL (ref 0.40–1.50)
GFR: 76.15 mL/min (ref >60.00)
GLUCOSE: 94 mg/dL (ref 70–99)
POTASSIUM: 4.2 meq/L (ref 3.5–5.1)
SODIUM: 136 meq/L (ref 135–145)

## 2014-08-21 NOTE — Patient Instructions (Addendum)
Go to the lab on the way out.  We'll contact you with your lab report. Finish the antibiotics.   When this is resolved, let me know and we can set you up with the hand clinic.  Take care.  Glad to see you.

## 2014-08-21 NOTE — Progress Notes (Signed)
Pre visit review using our clinic review tool, if applicable. No additional management support is needed unless otherwise documented below in the visit note.  Admitted with diverticulitis.  CT with are acute inflammatory changes surrounding a diverticulum of the proximal sigmoid colon with appearances typical of acute diverticulitis. There is no abscess. There is no extraluminal air. There is no bowel obstruction. Treated with abx in meantime.  He feels back to baseline.  No FCNAVD.  No abd pain.  Not passing blood.  He has f/u with GI pending.   Hospital course d/w pt.    Meds, vitals, and allergies reviewed.   ROS: See HPI.  Otherwise, noncontributory.  GEN: nad, alert and oriented HEENT: mucous membranes moist NECK: supple w/o LA CV: rrr PULM: ctab, no inc wob ABD: soft, +bs EXT: no edema SKIN: no acute rash He has incidental B 5th PIP and DIP joint swelling with pain on ROM.  This is chronic.

## 2014-08-22 DIAGNOSIS — M79646 Pain in unspecified finger(s): Secondary | ICD-10-CM | POA: Insufficient documentation

## 2014-08-22 NOTE — Assessment & Plan Note (Signed)
Now resolved. Treated with abx in meantime.  He feels back to baseline.  No FCNAVD now.  No abd pain now.  Not passing blood.  He has f/u with GI pending.   Hospital course d/w pt.   See notes on labs.

## 2014-08-22 NOTE — Assessment & Plan Note (Signed)
When his diverticulitis treatment/GI f/u is done, we can refer over to hand clinic if needed.  He agrees.  He'll update me when desired.

## 2014-08-24 ENCOUNTER — Telehealth: Payer: Self-pay | Admitting: Internal Medicine

## 2014-08-24 NOTE — Telephone Encounter (Signed)
Please stop Fl;agyl which may be causing nausea.

## 2014-08-24 NOTE — Telephone Encounter (Signed)
Pt states he is going to finish his Flagyl and Cipro today. Reports that for the past 2-3 days he has had increased nausea, the pain in his side has been gone for 8-10 days now. Pt wants to make sure he does not need anything further. Please advise.

## 2014-08-25 NOTE — Telephone Encounter (Signed)
Left a message for patient to call back. 

## 2014-08-25 NOTE — Telephone Encounter (Signed)
Patient notified. He states he is feeling better today.

## 2014-09-05 ENCOUNTER — Encounter: Payer: Self-pay | Admitting: Internal Medicine

## 2014-09-05 ENCOUNTER — Ambulatory Visit (INDEPENDENT_AMBULATORY_CARE_PROVIDER_SITE_OTHER): Payer: PPO | Admitting: Internal Medicine

## 2014-09-05 VITALS — BP 110/68 | HR 68 | Ht 72.0 in | Wt 244.2 lb

## 2014-09-05 DIAGNOSIS — R933 Abnormal findings on diagnostic imaging of other parts of digestive tract: Secondary | ICD-10-CM

## 2014-09-05 DIAGNOSIS — K5732 Diverticulitis of large intestine without perforation or abscess without bleeding: Secondary | ICD-10-CM

## 2014-09-05 NOTE — Patient Instructions (Addendum)
I will call you after I have arranged an appointment with [facility]  Dr Elsie Stain

## 2014-09-05 NOTE — Progress Notes (Signed)
Sergio [name] June 19, 1940 [id number]  Note: This dictation was prepared with Dragon digital system. Any transcriptional errors that result from this procedure are unintentional.   History of Present Illness: This is a 74 year old white male presents after hospitalization for acute diverticulitis. He was  hospitalized from 6/10  To  08/13/2014. Discharged on Cipro 500 mg 3 times a day and Flagyl 500 mg 3 times a day for total 14 days. He is back to normal now. First episode  of diverticulitis  in 2007 and again in April 2014 while on a business trip to [place], [place].   CT scan of the abdomen on 08/11/2014 confirmed acute inflammation surrounding the proximal sigmoid colon. There was no abscess.or perforation. He has gradually improved  And completed the 14 days of antibiotics. He is currently back to high-fiber diet and fiber supplements. He has no significant cardiac history..     Past Medical History  Diagnosis Date  . Diverticulosis of colon 03/2005    via colonoscopy  . Hyperlipidemia 06/1995    started pravachol  . GERD (gastroesophageal reflux disease) 10/2005  . Arthritis     hand/finger OA  . Hyperglycemia   . Tubular adenoma of colon 04/29/10    Past Surgical History  Procedure Laterality Date  . Circumcision  09/1999    [name]  . Palatoplasy      tonsils and adenoids  . Septoplasty      sleep apnea  . Knee arthroscopy  04/2003    left, [name]  . Knee arthroscopy  12/2004    right, [name]  . Umbilical hernia repair  10/18/2005    [name]  . Shoulder surgery      Bilateral    No Known Allergies  Family history and social history have been reviewed.  Review of Systems: Regular bowel habits. He denies rectal bleeding. Last colonoscopy in February 2012 showed diverticulosis  The remainder of the 10 point ROS is negative except as outlined in the H&P  Physical Exam: General Appearance Well developed, in no distress Psychological Normal mood and  affect  Assessment and Plan:   74 year old white male four weeks post  acute diverticulitis with complete recovery and completion off antibiotic therapy. Back to the baseline. He came to discuss options for elective segmental sigmoid resection. He is interested in preventing another attack. We have discussed laparoscopically assisted  surgery, the indication and the fact that he already  had 3 documented episodes. His chanceof having another attack ais high. Currently he is in good  health  He had a umbilical hernia repaired a few years ago and is interested in seeing the same Psychologist, sport and exercise. We will make the appointment. In the meantime he will continue high-fiber diet and fiber supplements. He had  colonoscopy within  last four years but if the surgeon requested another colonoscopy we'll do that    [name] 09/05/2014

## 2014-09-06 ENCOUNTER — Telehealth: Payer: Self-pay

## 2014-09-06 NOTE — Telephone Encounter (Signed)
Spoke with Janett Billow at [facility] and scheduled surgical consult for patient on 09/25/2014 at 3:45, arrive at 3:15.  Called patient and relayed this information to him.  Patient acknowledged and understood

## 2014-09-25 ENCOUNTER — Other Ambulatory Visit: Payer: Self-pay | Admitting: Emergency Medicine

## 2014-09-25 ENCOUNTER — Other Ambulatory Visit: Payer: Self-pay | Admitting: General Surgery

## 2014-09-26 ENCOUNTER — Other Ambulatory Visit: Payer: Self-pay | Admitting: General Surgery

## 2014-09-26 DIAGNOSIS — R1084 Generalized abdominal pain: Secondary | ICD-10-CM

## 2014-09-26 DIAGNOSIS — K56699 Other intestinal obstruction unspecified as to partial versus complete obstruction: Secondary | ICD-10-CM

## 2014-09-29 ENCOUNTER — Other Ambulatory Visit: Payer: Self-pay | Admitting: General Surgery

## 2014-09-29 DIAGNOSIS — R1084 Generalized abdominal pain: Secondary | ICD-10-CM

## 2014-09-29 DIAGNOSIS — K56699 Other intestinal obstruction unspecified as to partial versus complete obstruction: Secondary | ICD-10-CM

## 2014-10-12 ENCOUNTER — Ambulatory Visit
Admission: RE | Admit: 2014-10-12 | Discharge: 2014-10-12 | Disposition: A | Discharge status: Home / Self Care | Payer: PPO | Source: Ambulatory Visit | Attending: General Surgery | Admitting: General Surgery

## 2014-10-18 ENCOUNTER — Other Ambulatory Visit: Payer: Self-pay | Admitting: Family Medicine

## 2014-11-02 ENCOUNTER — Other Ambulatory Visit: Payer: Self-pay | Admitting: General Surgery

## 2014-11-16 ENCOUNTER — Other Ambulatory Visit: Payer: Self-pay | Admitting: Family Medicine

## 2014-11-27 NOTE — Patient Instructions (Addendum)
YOUR PROCEDURE IS SCHEDULED ON : 12/04/14  REPORT TO [facility] MAIN ENTRANCE FOLLOW SIGNS TO EAST ELEVATOR - GO TO 3rd FLOOR CHECK IN AT 3 EAST NURSES STATION (SHORT STAY) AT:  8:30 AM  CALL THIS NUMBER IF YOU HAVE PROBLEMS THE MORNING OF SURGERY [phone]  REMEMBER:ONLY 1 PER PERSON MAY GO TO SHORT STAY WITH YOU TO GET READY THE MORNING OF YOUR SURGERY  DO NOT EAT FOOD OR DRINK LIQUIDS AFTER MIDNIGHT  TAKE THESE MEDICINES THE MORNING OF SURGERY: NONE  YOU MAY NOT HAVE ANY METAL ON YOUR BODY INCLUDING HAIR PINS AND PIERCING'S. DO NOT WEAR JEWELRY, MAKEUP, LOTIONS, POWDERS OR PERFUMES. DO NOT WEAR NAIL POLISH. DO NOT SHAVE 48 HRS PRIOR TO SURGERY. MEN MAY SHAVE FACE AND NECK.  DO NOT [place] [facility]. [facility] IS NOT RESPONSIBLE FOR VALUABLES.  CONTACTS, DENTURES OR PARTIALS MAY NOT BE WORN TO SURGERY. LEAVE SUITCASE IN CAR. CAN BE BROUGHT TO ROOM AFTER SURGERY.  PATIENTS DISCHARGED THE DAY OF SURGERY WILL NOT BE ALLOWED TO DRIVE HOME.  PLEASE READ OVER THE FOLLOWING INSTRUCTION SHEETS _________________________________________________________________________________                                           - PREPARING FOR SURGERY  Before surgery, you can play an important role.  Because skin is not sterile, your skin needs to be as free of germs as possible.  You can reduce the number of germs on your skin by washing with CHG (chlorahexidine gluconate) soap before surgery.  CHG is an antiseptic cleaner which kills germs and bonds with the skin to continue killing germs even after washing. Please DO NOT use if you have an allergy to CHG or antibacterial soaps.  If your skin becomes reddened/irritated stop using the CHG and inform your nurse when you arrive at Short Stay. Do not shave (including legs and underarms) for at least 48 hours prior to the first CHG shower.  You may shave your face. Please follow these instructions  carefully:   1.  Shower with CHG Soap the night before surgery and the  morning of Surgery.   2.  If you choose to wash your hair, wash your hair first as usual with your  normal  Shampoo.   3.  After you shampoo, rinse your hair and body thoroughly to remove the  shampoo.                                         4.  Use CHG as you would any other liquid soap.  You can apply chg directly  to the skin and wash . Gently wash with scrungie or clean wascloth    5.  Apply the CHG Soap to your body ONLY FROM THE NECK DOWN.   Do not use on open                           Wound or open sores. Avoid contact with eyes, ears mouth and genitals (private parts).                        Genitals (private parts) with your normal soap.  6.  Wash thoroughly, paying special attention to the area where your surgery  will be performed.   7.  Thoroughly rinse your body with warm water from the neck down.   8.  DO NOT shower/wash with your normal soap after using and rinsing off  the CHG Soap .                9.  Pat yourself dry with a clean towel.             10.  Wear clean night clothes to bed after shower             11.  Place clean sheets on your bed the night of your first shower and do not  sleep with pets.  Day of Surgery : Do not apply any lotions/deodorants the morning of surgery.  Please wear clean clothes to the hospital/surgery center.  FAILURE TO FOLLOW THESE INSTRUCTIONS MAY RESULT IN THE CANCELLATION OF YOUR SURGERY    PATIENT SIGNATURE_________________________________  ______________________________________________________________________

## 2014-11-28 ENCOUNTER — Inpatient Hospital Stay (HOSPITAL_COMMUNITY): Admission: RE | Admit: 2014-11-28 | Payer: PPO | Source: Ambulatory Visit

## 2014-11-29 ENCOUNTER — Encounter (HOSPITAL_COMMUNITY): Payer: Self-pay

## 2014-11-29 ENCOUNTER — Encounter (HOSPITAL_COMMUNITY)
Admission: RE | Admit: 2014-11-29 | Discharge: 2014-11-29 | Disposition: A | Discharge status: Home / Self Care | Payer: PPO | Source: Ambulatory Visit | Attending: General Surgery | Admitting: General Surgery

## 2014-11-29 DIAGNOSIS — Z01818 Encounter for other preprocedural examination: Secondary | ICD-10-CM | POA: Insufficient documentation

## 2014-11-29 HISTORY — DX: Plantar fascial fibromatosis: M72.2

## 2014-11-29 LAB — COMPREHENSIVE METABOLIC PANEL
ALBUMIN: 4 g/dL (ref 3.5–5.0)
ALT: 25 U/L (ref 17–63)
ANION GAP: 8 (ref 5–15)
AST: 32 U/L (ref 15–41)
Alkaline Phosphatase: 69 U/L (ref 38–126)
BUN: 19 mg/dL (ref 6–20)
CHLORIDE: 105 mmol/L (ref 101–111)
CO2: 29 mmol/L (ref 22–32)
Calcium: 9.1 mg/dL (ref 8.9–10.3)
Creatinine, Ser: 0.96 mg/dL (ref 0.61–1.24)
GFR calc Af Amer: >60 mL/min (ref >60)
GFR calc non Af Amer: >60 mL/min (ref >60)
GLUCOSE: 129 mg/dL — AB (ref 65–99)
POTASSIUM: 4.3 mmol/L (ref 3.5–5.1)
SODIUM: 142 mmol/L (ref 135–145)
Total Bilirubin: 0.4 mg/dL (ref 0.3–1.2)
Total Protein: 6.8 g/dL (ref 6.5–8.1)

## 2014-11-29 LAB — CBC WITH DIFFERENTIAL/PLATELET
BASOS PCT: 0 %
Basophils Absolute: 0 10*3/uL (ref 0.0–0.1)
EOS ABS: 0.1 10*3/uL (ref 0.0–0.7)
Eosinophils Relative: 3 %
HCT: 43.9 % (ref 39.0–52.0)
HEMOGLOBIN: 14.2 g/dL (ref 13.0–17.0)
Lymphocytes Relative: 32 %
Lymphs Abs: 1.5 10*3/uL (ref 0.7–4.0)
MCH: 29.3 pg (ref 26.0–34.0)
MCHC: 32.3 g/dL (ref 30.0–36.0)
MCV: 90.5 fL (ref 78.0–100.0)
MONO ABS: 0.3 10*3/uL (ref 0.1–1.0)
MONOS PCT: 7 %
NEUTROS PCT: 58 %
Neutro Abs: 2.7 10*3/uL (ref 1.7–7.7)
PLATELETS: 144 10*3/uL — AB (ref 150–400)
RBC: 4.85 MIL/uL (ref 4.22–5.81)
RDW: 13.5 % (ref 11.5–15.5)
WBC: 4.7 10*3/uL (ref 4.0–10.5)

## 2014-11-29 LAB — URINALYSIS, ROUTINE W REFLEX MICROSCOPIC
Bilirubin Urine: NEGATIVE
Glucose, UA: NEGATIVE mg/dL
Hgb urine dipstick: NEGATIVE
KETONES UR: NEGATIVE mg/dL
LEUKOCYTES UA: NEGATIVE
NITRITE: NEGATIVE
PH: 5.5 (ref 5.0–8.0)
Protein, ur: NEGATIVE mg/dL
SPECIFIC GRAVITY, URINE: 1.022 (ref 1.005–1.030)
Urobilinogen, UA: 0.2 mg/dL (ref 0.0–1.0)

## 2014-11-29 LAB — ABO/RH: ABO/RH(D): A POS

## 2014-11-30 LAB — HEMOGLOBIN A1C
Hgb A1c MFr Bld: 5.9 % — ABNORMAL HIGH (ref 4.8–5.6)
Mean Plasma Glucose: 123 mg/dL

## 2014-12-01 NOTE — H&P (Signed)
Sergio [name]  Location: [facility] Patient #: [id number] DOB: 23-Apr-1940 Married / Language: English / Race: White Male       History of Present Illness   The patient is a 74 year old male who presents with diverticulitis. Very pleasant 74 year old man returns following his barium enema to discuss elective surgery for recurrent diverticulitis. He was initially sent to me by Dr. Delfin [name]. Dr. Elsie [name] is his PCP. He goes to my church. He has had 3 documented attacks of sigmoid diverticulitis as outlined in my previous note. The last required a three-day hospitalization. No complications. Last colonoscopy 2012 with a tubular adenoma and mild diverticulosis. His barium enema shows multiple diverticula concentrated in the rectosigmoid colon and very few diverticula elsewhere. He has done well since I saw him last. I advised him that since he has had 3 documented episodes of uncomplicated diverticulitis, that the risk of medical management may outweigh the risk of surgery, and that my recommendation is to offer him elective laparoscopic-assisted sigmoid colectomy, possible open sigmoid colectomy. I discussed the techniques with him including port placement, splenic flexure takedown, stapled versus handsewn anastomosis, extraction site incision, possibility of a larger incision, injury to adjacent organs, anastomotic leak requiring further surgery, and so forth. He wants to go ahead sometime in the next month. He request Sergio [name] long. He was given antibiotic and mechanical bowel prep. I talked to him about entereg He understands the indications, details, techniques and numerous risk of elective sigmoid colectomy. All his questions were answered. He understands all these issues. His wife is with him today. He agrees with this plan. Minimal comorbidities. BMI 32. 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 Hyperlipidemia. Prior umbilical hernia repair without  mesh.   Allergies  No Known Drug Allergies07/25/2016  Medication History  Pravastatin Sodium (80MG  Tablet, Oral) Active. Multiple Vitamin (Oral) Active. Aspirin EC (81MG  Tablet DR, Oral) Active. Medications Reconciled  Vitals   Weight: 246 lb Height: 73in Body Surface Area: 2.4 m Body Mass Index: 32.46 kg/m Temp.: 97.55F  Pulse: 66 (Regular)  BP: 104/62 (Sitting, Left Arm, Standard)    Physical Exam  General Mental Status-Alert. General Appearance-Not in acute distress. Build & Nutrition-Well nourished. Posture-Normal posture. Gait-Normal.  Head and Neck Head-normocephalic, atraumatic with no lesions or palpable masses. Trachea-midline. Thyroid Gland Characteristics - normal size and consistency and no palpable nodules.  Chest and Lung Exam Chest and lung exam reveals -on auscultation, normal breath sounds, no adventitious sounds and normal vocal resonance.  Cardiovascular Cardiovascular examination reveals -normal heart sounds, regular rate and rhythm with no murmurs and femoral artery auscultation bilaterally reveals normal pulses, no bruits, no thrills.  Abdomen Inspection Inspection of the abdomen reveals - No Hernias. Palpation/Percussion Palpation and Percussion of the abdomen reveal - Soft, Non Tender, No Rigidity (guarding), No hepatosplenomegaly and No Palpable abdominal masses. Note: Transverse incision in upper umbilical rim. Well-healed no hernia. Nontender.   Neurologic Neurologic evaluation reveals -alert and oriented x 3 with no impairment of recent or remote memory, normal attention span and ability to concentrate, normal sensation and normal coordination.  Musculoskeletal Normal Exam - Bilateral-Upper Extremity Strength Normal and Lower Extremity Strength Normal.    Assessment & Plan DIVERTICULITIS, COLON (562.11  K57.32)   You are being scheduled for surgery - Our schedulers will call you. The  surgery will be performed at [facility] barium enema shows a focal area of concentrated diverticula in the sigmoid colon. This correlates with your CT scan findings We have discussed  the indications, techniques, and risks of elective laparoscopic-assisted sigmoid colectomy in detail. You have decided to go ahead with this surgery, considering your 3 documented episodes of diverticulitis you will undergo a mechanical bowel prep and an antibiotic pill bowel prep the day before surgery Please read the written information and pamphlets that we have given you    You should hear from our office's scheduling department within 5 working days about the location, date, and time of surgery. We try to make accommodations for patient's preferences in scheduling surgery, but sometimes the OR schedule or the surgeon's schedule prevents [place] from making those accommodations.  If you have not heard from our office [phone] in 5 working days, call the office and ask for your surgeon's nurse.  If you have other questions about your diagnosis, plan, or surgery, call the office and ask for your surgeon's nurse.  Started Neomycin Sulfate 500MG , 2 (two) Tablet SEE NOTE, #6, 11/02/2014, No Refill. Local Order: TAKE TWO TABLETS AT 2 PM, 3 PM, AND 10 PM THE DAY PRIOR TO SURGERY Started Flagyl 500MG , 2 (two) Tablet SEE NOTE, #6, 11/02/2014, No Refill. Local Order: Take at 2pm, 3pm, and 10pm the day prior to your colon operation  Pt Education - Pamphlet Given - Laparoscopic Colorectal Surgery: discussed with patient and provided information. Pt Education - CCS Diverticular Disease (AT)  BMI 32.0-32.9,ADULT (V85.32  Z68.32) BPH WITHOUT URINARY OBSTRUCTION (600.00  N40.0) HYPERLIPIDEMIA, ACQUIRED (272.4  E78.5)   Sergio [name], M.D., [facility] General and Minimally invasive Surgery Breast and Colorectal Surgery Office:   [phone] Pager:   [phone]

## 2014-12-04 ENCOUNTER — Inpatient Hospital Stay (HOSPITAL_COMMUNITY): Payer: PPO

## 2014-12-04 ENCOUNTER — Encounter (HOSPITAL_COMMUNITY): Payer: Self-pay | Admitting: *Deleted

## 2014-12-04 ENCOUNTER — Other Ambulatory Visit: Payer: Self-pay | Admitting: Cardiology

## 2014-12-04 ENCOUNTER — Inpatient Hospital Stay (HOSPITAL_COMMUNITY)
Admission: RE | Admit: 2014-12-04 | Discharge: 2014-12-05 | DRG: 392 | Disposition: A | Discharge status: Home / Self Care | Payer: PPO | Source: Ambulatory Visit | Attending: General Surgery | Admitting: General Surgery

## 2014-12-04 ENCOUNTER — Encounter (HOSPITAL_COMMUNITY)
Admission: RE | Disposition: A | Discharge status: Home / Self Care | Payer: Self-pay | Source: Ambulatory Visit | Attending: General Surgery

## 2014-12-04 ENCOUNTER — Inpatient Hospital Stay (HOSPITAL_COMMUNITY): Payer: PPO | Admitting: Certified Registered Nurse Anesthetist

## 2014-12-04 DIAGNOSIS — K5792 Diverticulitis of intestine, part unspecified, without perforation or abscess without bleeding: Secondary | ICD-10-CM | POA: Diagnosis present

## 2014-12-04 DIAGNOSIS — E785 Hyperlipidemia, unspecified: Secondary | ICD-10-CM | POA: Diagnosis present

## 2014-12-04 DIAGNOSIS — Z538 Procedure and treatment not carried out for other reasons: Secondary | ICD-10-CM | POA: Diagnosis not present

## 2014-12-04 DIAGNOSIS — Z833 Family history of diabetes mellitus: Secondary | ICD-10-CM

## 2014-12-04 DIAGNOSIS — Z87891 Personal history of nicotine dependence: Secondary | ICD-10-CM

## 2014-12-04 DIAGNOSIS — N400 Enlarged prostate without lower urinary tract symptoms: Secondary | ICD-10-CM | POA: Diagnosis present

## 2014-12-04 DIAGNOSIS — K5732 Diverticulitis of large intestine without perforation or abscess without bleeding: Secondary | ICD-10-CM | POA: Diagnosis present

## 2014-12-04 DIAGNOSIS — K219 Gastro-esophageal reflux disease without esophagitis: Secondary | ICD-10-CM | POA: Diagnosis present

## 2014-12-04 DIAGNOSIS — R001 Bradycardia, unspecified: Secondary | ICD-10-CM | POA: Diagnosis not present

## 2014-12-04 DIAGNOSIS — Z7982 Long term (current) use of aspirin: Secondary | ICD-10-CM | POA: Diagnosis not present

## 2014-12-04 DIAGNOSIS — I9789 Other postprocedural complications and disorders of the circulatory system, not elsewhere classified: Secondary | ICD-10-CM | POA: Insufficient documentation

## 2014-12-04 LAB — BASIC METABOLIC PANEL
Anion gap: 3 — ABNORMAL LOW (ref 5–15)
BUN: 12 mg/dL (ref 6–20)
CALCIUM: 8.8 mg/dL — AB (ref 8.9–10.3)
CO2: 29 mmol/L (ref 22–32)
CREATININE: 1.07 mg/dL (ref 0.61–1.24)
Chloride: 107 mmol/L (ref 101–111)
Glucose, Bld: 107 mg/dL — ABNORMAL HIGH (ref 65–99)
Potassium: 4.4 mmol/L (ref 3.5–5.1)
SODIUM: 139 mmol/L (ref 135–145)

## 2014-12-04 LAB — CK TOTAL AND CKMB (NOT AT ARMC)
CK, MB: 2.1 ng/mL (ref 0.5–5.0)
RELATIVE INDEX: INVALID (ref 0.0–2.5)
Total CK: 73 U/L (ref 49–397)

## 2014-12-04 LAB — CBC
HCT: 42.1 % (ref 39.0–52.0)
Hemoglobin: 13.6 g/dL (ref 13.0–17.0)
MCH: 29 pg (ref 26.0–34.0)
MCHC: 32.3 g/dL (ref 30.0–36.0)
MCV: 89.8 fL (ref 78.0–100.0)
Platelets: 131 10*3/uL — ABNORMAL LOW (ref 150–400)
RBC: 4.69 MIL/uL (ref 4.22–5.81)
RDW: 13.4 % (ref 11.5–15.5)
WBC: 4.2 10*3/uL (ref 4.0–10.5)

## 2014-12-04 LAB — TROPONIN I

## 2014-12-04 LAB — MAGNESIUM: MAGNESIUM: 2.2 mg/dL (ref 1.7–2.4)

## 2014-12-04 LAB — MRSA PCR SCREENING: MRSA BY PCR: NEGATIVE

## 2014-12-04 LAB — TYPE AND SCREEN
ABO/RH(D): A POS
ANTIBODY SCREEN: NEGATIVE

## 2014-12-04 SURGERY — COLECTOMY, SIGMOID, LAPAROSCOPIC
Anesthesia: General

## 2014-12-04 MED ORDER — PERFLUTREN LIPID MICROSPHERE
1.0000 mL | INTRAVENOUS | Status: AC | PRN
  Administered 2014-12-04: 4 mL via INTRAVENOUS
  Filled 2014-12-04: qty 10

## 2014-12-04 MED ORDER — HYDROMORPHONE HCL 1 MG/ML IJ SOLN: 0.2500 mg | INTRAMUSCULAR | Status: DC | PRN

## 2014-12-04 MED ORDER — ONDANSETRON 4 MG PO TBDP
4.0000 mg | ORAL_TABLET | Freq: Four times a day (QID) | ORAL | Status: DC | PRN
  Filled 2014-12-04: qty 1

## 2014-12-04 MED ORDER — PROPOFOL 10 MG/ML IV BOLUS
INTRAVENOUS | Status: AC
  Filled 2014-12-04: qty 20

## 2014-12-04 MED ORDER — ASPIRIN 81 MG PO CHEW: 81.0000 mg | CHEWABLE_TABLET | Freq: Every day | ORAL | Status: DC

## 2014-12-04 MED ORDER — GLYCOPYRROLATE 0.2 MG/ML IJ SOLN
INTRAMUSCULAR | Status: AC
  Filled 2014-12-04: qty 5

## 2014-12-04 MED ORDER — CEFOTETAN DISODIUM-DEXTROSE 2-2.08 GM-% IV SOLR
INTRAVENOUS | Status: AC
  Filled 2014-12-04: qty 50

## 2014-12-04 MED ORDER — ALVIMOPAN 12 MG PO CAPS
12.0000 mg | ORAL_CAPSULE | Freq: Once | ORAL | Status: AC
  Administered 2014-12-04: 12 mg via ORAL
  Filled 2014-12-04: qty 1

## 2014-12-04 MED ORDER — HEPARIN SODIUM (PORCINE) 5000 UNIT/ML IJ SOLN: 5000.0000 [IU] | Freq: Three times a day (TID) | INTRAMUSCULAR | Status: DC

## 2014-12-04 MED ORDER — PERFLUTREN LIPID MICROSPHERE
INTRAVENOUS | Status: AC
  Filled 2014-12-04: qty 10

## 2014-12-04 MED ORDER — PANTOPRAZOLE SODIUM 40 MG IV SOLR: 40.0000 mg | Freq: Every day | INTRAVENOUS | Status: DC

## 2014-12-04 MED ORDER — ASPIRIN 81 MG PO TABS: 81.0000 mg | ORAL_TABLET | Freq: Every day | ORAL | Status: DC

## 2014-12-04 MED ORDER — PROMETHAZINE HCL 25 MG/ML IJ SOLN: 6.2500 mg | INTRAMUSCULAR | Status: DC | PRN

## 2014-12-04 MED ORDER — MIDAZOLAM HCL 2 MG/2ML IJ SOLN
INTRAMUSCULAR | Status: AC
  Filled 2014-12-04: qty 4

## 2014-12-04 MED ORDER — EPHEDRINE SULFATE 50 MG/ML IJ SOLN
INTRAMUSCULAR | Status: AC
  Filled 2014-12-04: qty 1

## 2014-12-04 MED ORDER — FENTANYL CITRATE (PF) 100 MCG/2ML IJ SOLN
INTRAMUSCULAR | Status: DC | PRN
  Administered 2014-12-04: 100 ug via INTRAVENOUS

## 2014-12-04 MED ORDER — ROCURONIUM BROMIDE 100 MG/10ML IV SOLN
INTRAVENOUS | Status: DC | PRN
  Administered 2014-12-04: 30 mg via INTRAVENOUS

## 2014-12-04 MED ORDER — DEXTROSE 5 % IV SOLN
2.0000 g | INTRAVENOUS | Status: AC
  Administered 2014-12-04: 2 g via INTRAVENOUS
  Filled 2014-12-04: qty 2

## 2014-12-04 MED ORDER — FENTANYL CITRATE (PF) 250 MCG/5ML IJ SOLN
INTRAMUSCULAR | Status: AC
  Filled 2014-12-04: qty 25

## 2014-12-04 MED ORDER — BUPIVACAINE-EPINEPHRINE (PF) 0.5% -1:200000 IJ SOLN
INTRAMUSCULAR | Status: AC
  Filled 2014-12-04: qty 30

## 2014-12-04 MED ORDER — ZOLPIDEM TARTRATE 5 MG PO TABS
5.0000 mg | ORAL_TABLET | Freq: Every evening | ORAL | Status: DC | PRN
  Administered 2014-12-04: 5 mg via ORAL
  Filled 2014-12-04: qty 1

## 2014-12-04 MED ORDER — NEOSTIGMINE METHYLSULFATE 10 MG/10ML IV SOLN
INTRAVENOUS | Status: DC | PRN
  Administered 2014-12-04: 5 mg via INTRAVENOUS

## 2014-12-04 MED ORDER — PROPOFOL 10 MG/ML IV BOLUS
INTRAVENOUS | Status: DC | PRN
Start: 2014-12-04 — End: 2014-12-04
  Administered 2014-12-04: 200 mg via INTRAVENOUS

## 2014-12-04 MED ORDER — EPHEDRINE SULFATE 50 MG/ML IJ SOLN
INTRAMUSCULAR | Status: DC | PRN
  Administered 2014-12-04: 10 mg via INTRAVENOUS

## 2014-12-04 MED ORDER — LIDOCAINE HCL (CARDIAC) 20 MG/ML IV SOLN
INTRAVENOUS | Status: DC | PRN
  Administered 2014-12-04: 50 mg via INTRAVENOUS

## 2014-12-04 MED ORDER — FENTANYL CITRATE (PF) 100 MCG/2ML IJ SOLN: 12.5000 ug | INTRAMUSCULAR | Status: DC | PRN

## 2014-12-04 MED ORDER — ONDANSETRON HCL 4 MG/2ML IJ SOLN: 4.0000 mg | Freq: Four times a day (QID) | INTRAMUSCULAR | Status: DC | PRN

## 2014-12-04 MED ORDER — DIPHENHYDRAMINE HCL 50 MG/ML IJ SOLN: 12.5000 mg | Freq: Four times a day (QID) | INTRAMUSCULAR | Status: DC | PRN

## 2014-12-04 MED ORDER — LACTATED RINGERS IV SOLN
INTRAVENOUS | Status: DC
  Administered 2014-12-04: 12:00:00 via INTRAVENOUS
  Administered 2014-12-04: 1000 mL via INTRAVENOUS
  Administered 2014-12-04: 11:00:00 via INTRAVENOUS

## 2014-12-04 MED ORDER — LIDOCAINE HCL (CARDIAC) 20 MG/ML IV SOLN
INTRAVENOUS | Status: AC
  Filled 2014-12-04: qty 5

## 2014-12-04 MED ORDER — SUCCINYLCHOLINE CHLORIDE 20 MG/ML IJ SOLN
INTRAMUSCULAR | Status: DC | PRN
  Administered 2014-12-04: 100 mg via INTRAVENOUS

## 2014-12-04 MED ORDER — SODIUM CHLORIDE 0.9 % IJ SOLN
INTRAMUSCULAR | Status: AC
  Filled 2014-12-04: qty 10

## 2014-12-04 MED ORDER — MIDAZOLAM HCL 5 MG/5ML IJ SOLN
INTRAMUSCULAR | Status: DC | PRN
  Administered 2014-12-04: 2 mg via INTRAVENOUS

## 2014-12-04 MED ORDER — ROCURONIUM BROMIDE 100 MG/10ML IV SOLN
INTRAVENOUS | Status: AC
  Filled 2014-12-04: qty 2

## 2014-12-04 MED ORDER — NEOSTIGMINE METHYLSULFATE 10 MG/10ML IV SOLN
INTRAVENOUS | Status: AC
  Filled 2014-12-04: qty 1

## 2014-12-04 MED ORDER — POTASSIUM CHLORIDE IN NACL 20-0.9 MEQ/L-% IV SOLN
INTRAVENOUS | Status: DC
  Administered 2014-12-04: 13:00:00 via INTRAVENOUS
  Filled 2014-12-04: qty 1000

## 2014-12-04 MED ORDER — GLYCOPYRROLATE 0.2 MG/ML IJ SOLN
INTRAMUSCULAR | Status: DC | PRN
  Administered 2014-12-04: 0.2 mg via INTRAVENOUS
  Administered 2014-12-04: 0.6 mg via INTRAVENOUS
  Administered 2014-12-04: 0.2 mg via INTRAVENOUS

## 2014-12-04 MED ORDER — DIPHENHYDRAMINE HCL 12.5 MG/5ML PO ELIX: 12.5000 mg | ORAL_SOLUTION | Freq: Four times a day (QID) | ORAL | Status: DC | PRN

## 2014-12-04 SURGICAL SUPPLY — 57 items
APPLIER CLIP 5 13 M/L LIGAMAX5 (MISCELLANEOUS) ×3
APPLIER CLIP ROT 10 11.4 M/L (STAPLE) ×3
APR CLP MED LRG 11.4X10 (STAPLE) ×1
APR CLP MED LRG 5 ANG JAW (MISCELLANEOUS) ×1
BLADE EXTENDED COATED 6.5IN (ELECTRODE) ×4 IMPLANT
BLADE HEX COATED 2.75 (ELECTRODE) ×4 IMPLANT
CELLS DAT CNTRL 66122 CELL SVR (MISCELLANEOUS) ×1 IMPLANT
CLAMP ENDO BABCK 10MM (STAPLE) ×4 IMPLANT
CLIP APPLIE 5 13 M/L LIGAMAX5 (MISCELLANEOUS) ×2 IMPLANT
CLIP APPLIE ROT 10 11.4 M/L (STAPLE) ×2 IMPLANT
CLOSURE WOUND 1/2 X4 (GAUZE/BANDAGES/DRESSINGS) ×1
COVER SURGICAL LIGHT HANDLE (MISCELLANEOUS) ×4 IMPLANT
DECANTER SPIKE VIAL GLASS SM (MISCELLANEOUS) ×4 IMPLANT
DEVICE TROCAR PUNCTURE CLOSURE (ENDOMECHANICALS) ×4 IMPLANT
DRAPE LAPAROSCOPIC ABDOMINAL (DRAPES) ×4 IMPLANT
DRAPE POUCH INSTRU U-SHP 10X18 (DRAPES) ×4 IMPLANT
ELECT REM PT RETURN 9FT ADLT (ELECTROSURGICAL) ×3
ELECTRODE REM PT RTRN 9FT ADLT (ELECTROSURGICAL) ×2 IMPLANT
ENSEAL DEVICE STD TIP 35CM (ENDOMECHANICALS) ×4 IMPLANT
GAUZE SPONGE 4X4 12PLY STRL (GAUZE/BANDAGES/DRESSINGS) ×4 IMPLANT
GLOVE BIOGEL PI IND STRL 7.0 (GLOVE) ×2 IMPLANT
GLOVE BIOGEL PI INDICATOR 7.0 (GLOVE) ×2
GLOVE EUDERMIC 7 POWDERFREE (GLOVE) ×4 IMPLANT
GOWN STRL REUS W/TWL LRG LVL3 (GOWN DISPOSABLE) ×4 IMPLANT
GOWN STRL REUS W/TWL XL LVL3 (GOWN DISPOSABLE) ×8 IMPLANT
LEGGING LITHOTOMY PAIR STRL (DRAPES) ×4 IMPLANT
LIGASURE IMPACT 36 18CM CVD LR (INSTRUMENTS) ×4 IMPLANT
NS IRRIG 1000ML POUR BTL (IV SOLUTION) ×4 IMPLANT
PACK COLON (CUSTOM PROCEDURE TRAY) ×4 IMPLANT
RETRACTOR WND ALEXIS 18 MED (MISCELLANEOUS) ×1 IMPLANT
RTRCTR WOUND ALEXIS 18CM MED (MISCELLANEOUS) ×3
SCISSORS LAP 5X35 DISP (ENDOMECHANICALS) ×4 IMPLANT
SET IRRIG TUBING LAPAROSCOPIC (IRRIGATION / IRRIGATOR) ×4 IMPLANT
SHEARS HARMONIC ACE PLUS 36CM (ENDOMECHANICALS) ×4 IMPLANT
SOLUTION ANTI FOG 6CC (MISCELLANEOUS) ×4 IMPLANT
SPONGE LAP 18X18 X RAY DECT (DISPOSABLE) ×8 IMPLANT
STAPLER VISISTAT 35W (STAPLE) ×4 IMPLANT
STRIP CLOSURE SKIN 1/2X4 (GAUZE/BANDAGES/DRESSINGS) ×3 IMPLANT
SUCTION POOLE TIP (SUCTIONS) ×4 IMPLANT
SUT PDS AB 1 CT1 27 (SUTURE) ×8 IMPLANT
SUT PDS AB 1 TP1 96 (SUTURE) ×8 IMPLANT
SUT SILK 2 0 (SUTURE) ×3
SUT SILK 2 0 SH CR/8 (SUTURE) ×4 IMPLANT
SUT SILK 2-0 18XBRD TIE 12 (SUTURE) ×2 IMPLANT
SUT SILK 3 0 (SUTURE) ×3
SUT SILK 3 0 SH CR/8 (SUTURE) ×8 IMPLANT
SUT SILK 3-0 18XBRD TIE 12 (SUTURE) ×2 IMPLANT
TOWEL OR 17X26 10 PK STRL BLUE (TOWEL DISPOSABLE) ×8 IMPLANT
TRAY FOLEY W/METER SILVER 14FR (SET/KITS/TRAYS/PACK) ×4 IMPLANT
TRAY FOLEY W/METER SILVER 16FR (SET/KITS/TRAYS/PACK) ×4 IMPLANT
TROCAR BLADELESS OPT 5 75 (ENDOMECHANICALS) ×4 IMPLANT
TROCAR XCEL BLUNT TIP 100MML (ENDOMECHANICALS) ×4 IMPLANT
TROCAR XCEL NON-BLD 11X100MML (ENDOMECHANICALS) ×4 IMPLANT
TROCAR XCEL UNIV SLVE 11M 100M (ENDOMECHANICALS) ×4 IMPLANT
TUBING INSUFFLATION 10FT LAP (TUBING) ×4 IMPLANT
WATER STERILE IRR 1500ML POUR (IV SOLUTION) ×4 IMPLANT
YANKAUER SUCT BULB TIP NO VENT (SUCTIONS) ×4 IMPLANT

## 2014-12-04 NOTE — Transfer of Care (Signed)
Immediate Anesthesia Transfer of Care Note  Patient: Sergio [name]  Procedure(s) Performed: Procedure(s): LAPAROSCOPIC ASSISTED  SIGMOID COLECTOMY POSSIBLE OPEN (N/A)  Patient Location: PACU  Anesthesia Type:General  Level of Consciousness: awake, alert  and oriented  Airway & Oxygen Therapy: Patient Spontanous Breathing and Patient connected to face mask oxygen  Post-op Assessment: Report given to RN and Post -op Vital signs reviewed and stable  Post vital signs: Reviewed and stable  Last Vitals:  Filed Vitals:   12/04/14 0837  BP: 114/77  Pulse: 52  Temp: 36.4 C  Resp: 18    Complications: No apparent anesthesia complications

## 2014-12-04 NOTE — Anesthesia Postprocedure Evaluation (Signed)
  Anesthesia Post-op Note  Patient: Sergio [name]  Procedure(s) Performed: Procedure(s): LAPAROSCOPIC ASSISTED  SIGMOID COLECTOMY POSSIBLE OPEN (N/A)  Patient Location: PACU  Anesthesia Type:General  Level of Consciousness: awake  Airway and Oxygen Therapy: Patient Spontanous Breathing  Post-op Pain: mild  Post-op Assessment: Post-op Vital signs reviewed              Post-op Vital Signs: Reviewed  Last Vitals:  Filed Vitals:   12/04/14 1422  BP: 120/72  Pulse: 44  Temp:   Resp: 13    Complications: No apparent anesthesia complications

## 2014-12-04 NOTE — Op Note (Signed)
OP Note:  Surgery cancelled due to preop cardiac arrhythmias.   Sergio [name]. [name], M.D., [facility] General and Minimally invasive Surgery Breast and Colorectal Surgery Office:   [phone] Pager:   [phone]

## 2014-12-04 NOTE — Progress Notes (Signed)
Echocardiogram 2D Echocardiogram with Definity has been performed.  [name] 12/04/2014, 3:28 PM

## 2014-12-04 NOTE — Anesthesia Preprocedure Evaluation (Addendum)
Anesthesia Evaluation  Patient identified by MRN, date of birth, ID band Patient awake    Reviewed: Allergy & Precautions, NPO status , Patient's Chart, lab work & pertinent test results  Airway Mallampati: II  TM Distance: >3 FB Neck ROM: Full    Dental   Pulmonary former smoker,    breath sounds clear to auscultation       Cardiovascular negative cardio ROS   Rhythm:Regular Rate:Normal     Neuro/Psych    GI/Hepatic Neg liver ROS, GERD  ,  Endo/Other    Renal/GU negative Renal ROS     Musculoskeletal   Abdominal   Peds  Hematology   Anesthesia Other Findings   Reproductive/Obstetrics                            Anesthesia Physical Anesthesia Plan  ASA: II  Anesthesia Plan: General   Post-op Pain Management:    Induction: Intravenous  Airway Management Planned: Oral ETT  Additional Equipment:   Intra-op Plan:   Post-operative Plan: Extubation in OR  Informed Consent: I have reviewed the patients History and Physical, chart, labs and discussed the procedure including the risks, benefits and alternatives for the proposed anesthesia with the patient or authorized representative who has indicated his/her understanding and acceptance.   Dental advisory given  Plan Discussed with: CRNA and Anesthesiologist  Anesthesia Plan Comments:         Anesthesia Quick Evaluation

## 2014-12-04 NOTE — Consult Note (Signed)
Reason for Consult:   Transient bradycardia  Requesting Physician: Dr [name] Primary Cardiologist New  HPI:   Pleasant 74 y/o otherwise healthy male with no prior history of CAD, admitted for colectomy for diverticulitis and developed bradycardia in OR. Following induction of anesthesia the patient developed bradycardia down into the 20s.He was given Robinul and his heart rate came back up into the 40s.There were no ST segment changes on the single lead EKG monitor. After a few minutes his bradycardia returned.  His surgery was never started, it appears the bradycardia was with induction of anesthesia. The pt has no history of syncope. He tells me his HR has always been a little slow. An EKG from 2011 showed NSR, SB in the 50s. He is very active, runs/jogs/ pre cor, 3-4 times a week without chest pain or unusual dyspnea.   PMHx:  Past Medical History  Diagnosis Date  . Diverticulosis of colon 03/2005    via colonoscopy  . Hyperlipidemia 06/1995    started pravachol  . GERD (gastroesophageal reflux disease) 10/2005  . Arthritis     hand/finger OA  . Hyperglycemia   . Tubular adenoma of colon 04/29/10  . Plantar fasciitis of left foot     occ. none in 2 years    Past Surgical History  Procedure Laterality Date  . Circumcision  09/1999    [name]  . Palatoplasy      tonsils and adenoids/sleep apnea issue  . Septoplasty      sleep apnea  . Knee arthroscopy  04/2003    left, [name]  . Knee arthroscopy  12/2004    right, [name]  . Umbilical hernia repair  10/18/2005    [name]  . Shoulder surgery      Bilateral  . Cataract extraction, bilateral Bilateral     SOCHx:  reports that he has quit smoking. His smoking use included Cigars. He has never used smokeless tobacco. He reports that he drinks about 4.2 - 9.0 oz of alcohol per week. He reports that he does not use illicit drugs.  FAMHx: Family History  Problem Relation Age of Onset  . Diabetes  Mother     glucose intolerance  . Breast cancer Mother     deceased age 16  . Diabetes Father     partial left foot amputation, burn infection  . Multiple sclerosis Sister     deceased age 58  . Colon cancer Neg Hx   . Prostate cancer Neg Hx   . Pancreatic cancer Mother     ALLERGIES: No Known Allergies  ROS: Review of Systems: General: negative for chills, fever, night sweats or weight changes.  Cardiovascular: negative for chest pain, dyspnea on exertion, edema, orthopnea, palpitations, paroxysmal nocturnal dyspnea or shortness of breath HEENT: negative for any visual disturbances, blindness, glaucoma Dermatological: negative for rash Respiratory: negative for cough, hemoptysis, or wheezing Urologic: negative for hematuria or dysuria Abdominal: negative for nausea, vomiting, diarrhea, bright red blood per rectum, melena, or hematemesis Neurologic: negative for visual changes, syncope, or dizziness Musculoskeletal: negative for back pain, joint pain, or swelling Psych: cooperative and appropriate All other systems reviewed and are otherwise negative except as noted above.   HOME MEDICATIONS: Prior to Admission medications   Medication Sig Start Date End Date Taking? Authorizing Provider  aspirin 81 MG tablet Take 81 mg by mouth daily.     Yes Historical Provider, MD  Multiple Vitamin (MULTIVITAMIN) tablet  Take 1 tablet by mouth daily.     Yes Historical Provider, MD  naproxen sodium (ANAPROX) 220 MG tablet Take 220 mg by mouth as needed (for pain).   Yes Historical Provider, MD  pravastatin (PRAVACHOL) 80 MG tablet TAKE ONE TABLET BY MOUTH EVERY NIGHT AT BEDTIME 11/16/14   Tonia Ghent, MD    HOSPITAL MEDICATIONS: I have reviewed the patient's current medications.  VITALS: Blood pressure 120/82, pulse 50, temperature 96.9 F (36.1 C), temperature source Axillary, resp. rate 16, height 6\' 1"  (1.854 m), weight 242 lb 15.2 oz (110.2 kg), SpO2 98 %.  PHYSICAL  EXAM: General appearance: alert, cooperative and no distress Neck: no carotid bruit and no JVD Lungs: clear to auscultation bilaterally Heart: regular rate and rhythm Abdomen: soft, non-tender; bowel sounds normal; no masses,  no organomegaly Extremities: extremities normal, atraumatic, no cyanosis or edema Pulses: 2+ and symmetric Skin: Skin color, texture, turgor normal. No rashes or lesions Neurologic: Grossly normal  LABS: Results for orders placed or performed during the hospital encounter of 12/04/14 (from the past 24 hour(s))  CBC     Status: Abnormal   Collection Time: 12/04/14 12:25 PM  Result Value Ref Range   WBC 4.2 4.0 - 10.5 K/uL   RBC 4.69 4.22 - 5.81 MIL/uL   Hemoglobin 13.6 13.0 - 17.0 g/dL   HCT 42.1 39.0 - 52.0 %   MCV 89.8 78.0 - 100.0 fL   MCH 29.0 26.0 - 34.0 pg   MCHC 32.3 30.0 - 36.0 g/dL   RDW 13.4 11.5 - 15.5 %   Platelets 131 (L) 150 - 400 K/uL  Basic metabolic panel     Status: Abnormal   Collection Time: 12/04/14 12:25 PM  Result Value Ref Range   Sodium 139 135 - 145 mmol/L   Potassium 4.4 3.5 - 5.1 mmol/L   Chloride 107 101 - 111 mmol/L   CO2 29 22 - 32 mmol/L   Glucose, Bld 107 (H) 65 - 99 mg/dL   BUN 12 6 - 20 mg/dL   Creatinine, Ser 1.07 0.61 - 1.24 mg/dL   Calcium 8.8 (L) 8.9 - 10.3 mg/dL   GFR calc non Af Amer >60 >60 mL/min   GFR calc Af Amer >60 >60 mL/min   Anion gap 3 (L) 5 - 15  Magnesium     Status: None   Collection Time: 12/04/14 12:25 PM  Result Value Ref Range   Magnesium 2.2 1.7 - 2.4 mg/dL    EKG: NSR, SB 56 with 1st degree AVB- PR 218  IMAGING: Dg Chest Port 1 View  12/04/2014   CLINICAL DATA:  Bradycardia, status post sigmoid colectomy.  EXAM: PORTABLE CHEST 1 VIEW  COMPARISON:  03/12/2005  FINDINGS: Cardiomediastinal silhouette is normal. Mediastinal contours appear intact for portable technique.  There is no evidence of focal airspace consolidation, pleural effusion or pneumothorax.  Osseous structures are without  acute abnormality. Soft tissues are grossly normal.  IMPRESSION: No radiographic evidence of acute cardiopulmonary abnormality.   Electronically Signed   By: Fidela Salisbury M.D.   On: 12/04/2014 13:00    IMPRESSION: Principal Problem:   Bradycardia Active Problems:   Diverticulitis of colon without hemorrhage   Hyperlipemia   RECOMMENDATION: Check Troponin, check echo. If normal he should be OK to discharge with plans for OP treadmill and f/u with EP per Dr Johnsie Cancel.   Time Spent Directly with Patient: 30 minutes  Erlene Quan [phone] beeper 12/04/2014, 2:06 PM   Patient examined  discussed with nursing and CMA.  No heart history.  Baseline ECG with no AV block.  No chest pain Post op just complains of dry throat and irritation.  No strips available from induction. After fentanyl patient heart rate In ? 20's for less than 30 seconds x 2.  Again no documentation.  Currently NSR rates 60-70.  Discussed with Dr [name] who indicates surgery is elective and he would like his heart evaluation done first and reschedule surgery Will check troponin and echo today.  D/C am if telemetry ok and outpatient f/u with EP and schedule ETT to r/o  Ischemia and check chronotropic response to exercise.  Exam with clear lungs no murmur and benign abdomen  Currently  Jenkins Rouge

## 2014-12-04 NOTE — Anesthesia Procedure Notes (Signed)
Procedure Name: Intubation Date/Time: 12/04/2014 10:57 AM Performed by: Dimas Millin, Gaje Tennyson F Pre-anesthesia Checklist: Patient identified, Emergency Drugs available, Suction available, Patient being monitored and Timeout performed Patient Re-evaluated:Patient Re-evaluated prior to inductionOxygen Delivery Method: Circle system utilized Preoxygenation: Pre-oxygenation with 100% oxygen Intubation Type: IV induction Ventilation: Mask ventilation without difficulty Laryngoscope Size: Miller and 2 Grade View: Grade II Tube type: Oral Number of attempts: 2 Airway Equipment and Method: Stylet Placement Confirmation: ETT inserted through vocal cords under direct vision,  positive ETCO2 and breath sounds checked- equal and bilateral Secured at: 24 cm Tube secured with: Tape Dental Injury: Teeth and Oropharynx as per pre-operative assessment  Future Recommendations: Recommend- induction with short-acting agent, and alternative techniques readily available Comments: CRNA attemped to intubate patient unable to visualize cords. Floppy epiglottis. 2nd attempt performed by Dr. Oletta Lamas with ETT placed. +BBSE +ETCO2.

## 2014-12-04 NOTE — Interval H&P Note (Signed)
History and Physical Interval Note:  12/04/2014 10:08 AM  Sergio [name]  has presented today for surgery, with the diagnosis of diverticulitis  The various methods of treatment have been discussed with the patient and family. After consideration of risks, benefits and other options for treatment, the patient has consented to  Procedure(s): LAPAROSCOPIC ASSISTED  SIGMOID COLECTOMY POSSIBLE OPEN (N/A) as a surgical intervention .  The patient's history has been reviewed, patient examined, no change in status, stable for surgery.  I have reviewed the patient's chart and labs.  Questions were answered to the patient's satisfaction.     Adin Hector

## 2014-12-04 NOTE — Progress Notes (Signed)
  Gen. Surgery Intra-Op and PACU progress note:  Following induction of anesthesia the patient developed bradycardia down into the 20s.  He was given Robinul and his heart rate came back up into the 40s.  There were no ST segment changes on the single lead EKG monitor.  After a few minutes his bradycardia returned.  Dr. Oletta Lamas from anesthesia and I agreed that we should cancel the case and admit the patient to the stepdown unit for cardiac workup to rule out a cardiac event.  He has no history of cardiac disease and has no cardiac symptoms such as chest pain or dyspnea on exertion.  He does take a statin drug for hyperlipidemia.  His father had congestive heart failure.  He underwent mechanical and antibiotic bowel prep yesterday and had no problems with that.  At this point in time he has received 1-1/2 L of IV fluids.  I have discussed this with his wife and she is understanding and standing by.  Dr. Oletta Lamas from anesthesia is going to call cardiology and requested he be formally evaluated in PACU.   Edsel Petrin. [name], M.D., [facility] General and Minimally invasive Surgery Breast and Colorectal Surgery Office:   [phone] Pager:   [phone]

## 2014-12-05 LAB — TROPONIN I: Troponin I: <0.03 ng/mL (ref <0.031)

## 2014-12-05 NOTE — Discharge Summary (Signed)
Patient ID: BRITT PETRONI [id number] 74 y.o. [birth year]  Admit date: 12/04/2014  Discharge date and time: 12/05/2014  Admitting Physician: Adin Hector  Discharge Physician: Adin Hector  Admission Diagnoses: diverticulitis  Discharge Diagnoses: Severe bradycardia, resolved                                         Diverticulitis                                          BMI 32                                          Hyperlipidemia, acquired  Operations:    None  Admission Condition: good  Discharged Condition: good  Indication for Admission:  Very pleasant 74 year old man returns following his barium enema to discuss elective surgery for recurrent diverticulitis. He was initially sent to me by Dr. [name]. Dr. Elsie Stain is his PCP. He goes to my [employer]. He has had 3 documented attacks of sigmoid diverticulitis as outlined in my previous note. The last required a three-day hospitalization. No complications. Last colonoscopy 2012 with a tubular adenoma and mild diverticulosis. His barium enema shows multiple diverticula concentrated in the rectosigmoid colon and very few diverticula elsewhere. He has done well since I saw him last. I advised him that since he has had 3 documented episodes of uncomplicated diverticulitis, that the risk of medical management may outweigh the risk of surgery, and that my recommendation is to offer him elective laparoscopic-assisted sigmoid colectomy, possible open sigmoid colectomy. I discussed the techniques with him including port placement, splenic flexure takedown, stapled versus handsewn anastomosis, extraction site incision, possibility of a larger incision, injury to adjacent organs, anastomotic leak requiring further surgery, and so forth. He wants to go ahead sometime in the next month. He request [name] long. He was given antibiotic and mechanical bowel prep. I talked to him about entereg He understands the  indications, details, techniques and numerous risk of elective sigmoid colectomy. All his questions were answered. He understands all these issues. His wife is with him today. He agrees with this plan. Minimal comorbidities. BMI 32. 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 Hyperlipidemia. Prior umbilical hernia repair without mesh.  Hospital Course: The patient was brought to the hospital electively.  He had tolerated his mechanical and antibiotic bowel prep without issues.  He was taken to the operating room and following induction of anesthesia he developed severe bradycardia into the 20s.  He was given Robinul with transient return of heart rate to the mid 40s.  The bradycardia then recurred and the surgical procedure was canceled by Dr. Oletta Lamas of the anesthesia department.  The patient was brought to the recovery room where he remained stable and became alert and without chest pain or shortness of breath.     Cardiology consult was performed by Dr. Johnsie Cancel who felt that most likely he did not have a cardiac event but felt that outpatient cardiac evaluation during stress testing would be appropriate prior to rescheduling his elective surgery.  Portable chest x-ray showed no active cardiopulmonary disease.  Cardiac enzymes including troponins and CK-MBs remained normal.  Echocardiogram was performed which looked  very good normal wall thickness.  Normal systolic function.  Estimated ejection fraction 60-65%.  Wall motion normal.      He was observed overnight and had no further problems.  No chest pain.  No dyspnea.  No arrhythmias.  He remained in sinus bradycardia in the mid 50s.  He resume normal diet.  Had no trouble voiding.  Had no pain.  His situation was explained to him and he seemed to understand quite well the precautions that we were taking.    He is discharged home.  He will follow-up with cardiology in the next week or so for outpatient stress testing and completion of his cardiac workup.  He was  instructed to call me as soon as the cardiac workup is complete and we will reschedule his surgery and bring him into the office for a brief preop visit.  Consults: cardiology  Significant Diagnostic Studies: EKG, chest x-ray, echocardiogram, cardiac enzymes.  Treatments: IV hydration, telemetry observation.  Disposition: Home  Patient Instructions:    Medication List    TAKE these medications        aspirin 81 MG tablet  Take 81 mg by mouth daily.     multivitamin tablet  Take 1 tablet by mouth daily.     naproxen sodium 220 MG tablet  Commonly known as:  ANAPROX  Take 220 mg by mouth as needed (for pain).     pravastatin 80 MG tablet  Commonly known as:  PRAVACHOL  TAKE ONE TABLET BY MOUTH EVERY NIGHT AT BEDTIME        Activity: activity as tolerated Diet: low fat, low cholesterol diet Wound Care: none needed  Follow-up:  With [name] in 2-3 weeks .  Signed: Edsel Petrin. [name], M.D., FACS General and minimally invasive surgery Breast and Colorectal Surgery  12/05/2014, 6:06 AM

## 2014-12-05 NOTE — Discharge Instructions (Signed)
-  see above 

## 2014-12-05 NOTE — Progress Notes (Signed)
Patient ID: Sergio [name], male   DOB: December 13, 1940, 74 y.o.   MRN: [id number]    Subjective:  Denies SSCP, palpitations or Dyspnea Ready to go home   Objective:  Filed Vitals:   12/04/14 2000 12/04/14 2143 12/04/14 2332 12/05/14 0600  BP:  121/75  131/73  Pulse:  56  53  Temp: 99 F (37.2 C)  99.7 F (37.6 C)   TempSrc: Oral  Oral   Resp:    13  Height:      Weight:      SpO2:  96%  95%    Intake/Output from previous day:  Intake/Output Summary (Last 24 hours) at 12/05/14 0802 Last data filed at 12/05/14 0600  Gross per 24 hour  Intake 3117.5 ml  Output   2020 ml  Net 1097.5 ml    Physical Exam: Affect appropriate Healthy:  appears stated age HEENT: normal Neck supple with no adenopathy JVP normal no bruits no thyromegaly Lungs clear with no wheezing and good diaphragmatic motion Heart:  S1/S2 no murmur, no rub, gallop or click PMI normal Abdomen: benighn, BS positve, no tenderness, no AAA no bruit.  No HSM or HJR Distal pulses intact with no bruits No edema Neuro non-focal Skin warm and dry No muscular weakness   Lab Results: Basic Metabolic Panel:  Recent Labs  12/04/14 1225  NA 139  K 4.4  CL 107  CO2 29  GLUCOSE 107*  BUN 12  CREATININE 1.07  CALCIUM 8.8*  MG 2.2   Liver Function Tests: No results for input(s): AST, ALT, ALKPHOS, BILITOT, PROT, ALBUMIN in the last 72 hours. No results for input(s): LIPASE, AMYLASE in the last 72 hours. CBC:  Recent Labs  12/04/14 1225  WBC 4.2  HGB 13.6  HCT 42.1  MCV 89.8  PLT 131*   Cardiac Enzymes:  Recent Labs  12/04/14 1225 12/04/14 1732 12/05/14 0005  CKTOTAL 73  --   --   CKMB 2.1  --   --   TROPONINI <0.03 <0.03 <0.03    Imaging: Dg Chest Port 1 View  12/04/2014   CLINICAL DATA:  Bradycardia, status post sigmoid colectomy.  EXAM: PORTABLE CHEST 1 VIEW  COMPARISON:  03/12/2005  FINDINGS: Cardiomediastinal silhouette is normal. Mediastinal contours appear intact for portable  technique.  There is no evidence of focal airspace consolidation, pleural effusion or pneumothorax.  Osseous structures are without acute abnormality. Soft tissues are grossly normal.  IMPRESSION: No radiographic evidence of acute cardiopulmonary abnormality.   Electronically Signed   By: Fidela Salisbury M.D.   On: 12/04/2014 13:00    Cardiac Studies:  Telemetry:  SR rates 50-60 no AV block    Echo:  Normal EF 55-60%  Medications:   . aspirin  81 mg Oral Daily  . heparin  5,000 Units Subcutaneous 3 times per day  . pantoprazole (PROTONIX) IV  40 mg Intravenous QHS       Assessment/Plan:  Bradycardia:  R/O ECG ok telemetry normal Echo normal.  ? Related to bowel prep and agents used for induction of anesthesia Arranged for outpatient ETT and f/u with EP  Then can likely have elected surgery in 2-3 weeks   Jenkins Rouge 12/05/2014, 8:02 AM

## 2014-12-05 NOTE — Progress Notes (Signed)
Discharge instructions provided and discussed with patient in full. Patient very pleasant and with no signs of distress. IV removed. RN walked with patient downstairs to meet his wife.

## 2014-12-06 ENCOUNTER — Telehealth (HOSPITAL_COMMUNITY): Payer: Self-pay

## 2014-12-06 NOTE — Telephone Encounter (Signed)
Encounter complete. 

## 2014-12-07 ENCOUNTER — Ambulatory Visit (HOSPITAL_COMMUNITY)
Admission: RE | Admit: 2014-12-07 | Discharge: 2014-12-07 | Disposition: A | Discharge status: Home / Self Care | Payer: PPO | Source: Ambulatory Visit | Attending: Internal Medicine | Admitting: Internal Medicine

## 2014-12-07 DIAGNOSIS — R001 Bradycardia, unspecified: Secondary | ICD-10-CM | POA: Diagnosis not present

## 2014-12-07 LAB — EXERCISE TOLERANCE TEST
Estimated workload: 13.7 METS
Exercise duration (min): 12 min
Exercise duration (sec): 0 s
MPHR: 147 {beats}/min
Peak HR: 157 {beats}/min
Percent HR: 106 %
RPE: 17
Rest HR: 51 {beats}/min

## 2014-12-11 ENCOUNTER — Other Ambulatory Visit: Payer: Self-pay | Admitting: *Deleted

## 2014-12-11 DIAGNOSIS — R001 Bradycardia, unspecified: Secondary | ICD-10-CM

## 2014-12-12 ENCOUNTER — Encounter: Payer: Self-pay | Admitting: Cardiovascular Disease

## 2014-12-15 ENCOUNTER — Encounter (HOSPITAL_COMMUNITY): Payer: Self-pay

## 2014-12-15 ENCOUNTER — Ambulatory Visit (HOSPITAL_COMMUNITY)
Admission: RE | Admit: 2014-12-15 | Discharge: 2014-12-15 | Disposition: A | Discharge status: Home / Self Care | Payer: PPO | Source: Ambulatory Visit | Attending: Cardiovascular Disease | Admitting: Cardiovascular Disease

## 2014-12-15 DIAGNOSIS — R001 Bradycardia, unspecified: Secondary | ICD-10-CM | POA: Insufficient documentation

## 2014-12-15 MED ORDER — NITROGLYCERIN 0.4 MG SL SUBL
SUBLINGUAL_TABLET | SUBLINGUAL | Status: AC
  Filled 2014-12-15: qty 2

## 2014-12-15 MED ORDER — IOHEXOL 350 MG/ML SOLN
80.0000 mL | Freq: Once | INTRAVENOUS | Status: AC | PRN
  Administered 2014-12-15: 80 mL via INTRAVENOUS

## 2014-12-15 NOTE — Progress Notes (Signed)
Pt done with CT heart scan; IV removed, no bleeding noted before pt d/c.

## 2014-12-18 ENCOUNTER — Ambulatory Visit (INDEPENDENT_AMBULATORY_CARE_PROVIDER_SITE_OTHER): Payer: PPO | Admitting: Internal Medicine

## 2014-12-18 ENCOUNTER — Encounter: Payer: Self-pay | Admitting: Internal Medicine

## 2014-12-18 ENCOUNTER — Other Ambulatory Visit: Payer: Self-pay | Admitting: Family Medicine

## 2014-12-18 VITALS — BP 110/72 | HR 47 | Ht 73.0 in | Wt 242.8 lb

## 2014-12-18 DIAGNOSIS — R001 Bradycardia, unspecified: Secondary | ICD-10-CM | POA: Diagnosis not present

## 2014-12-18 DIAGNOSIS — R0683 Snoring: Secondary | ICD-10-CM | POA: Diagnosis not present

## 2014-12-18 DIAGNOSIS — I9789 Other postprocedural complications and disorders of the circulatory system, not elsewhere classified: Secondary | ICD-10-CM

## 2014-12-18 LAB — T4, FREE: Free T4: 0.83 ng/dL (ref 0.80–1.80)

## 2014-12-18 LAB — TSH: TSH: 2.479 u[IU]/mL (ref 0.350–4.500)

## 2014-12-18 NOTE — Telephone Encounter (Signed)
Received faxed refill request for   pravastatin (PRAVACHOL) 80 MG tablet 30 tablet 0 11/16/2014   Take 1 tablet by mouth every night at bedtime Dispense   30    Refill   0  Last prescribe on 11/16/2014. Last seen on 08/21/2014. No future appointment. Ok to refill?

## 2014-12-18 NOTE — Patient Instructions (Signed)
Medication Instructions:  Your physician recommends that you continue on your current medications as directed. Please refer to the Current Medication list given to you today.   Labwork: Your physician recommends that you return for lab work today: TSH/T4   Testing/Procedures:.  Your physician has recommended that you have a sleep study. This test records several body functions during sleep, including: brain activity, eye movement, oxygen and carbon dioxide blood levels, heart rate and rhythm, breathing rate and rhythm, the flow of air through your mouth and nose, snoring, body muscle movements, and chest and belly movement.     Follow-Up: Your physician recommends that you schedule a follow-up appointment as needed   Any Other Special Instructions Will Be Listed Below (If Applicable).

## 2014-12-19 MED ORDER — PRAVASTATIN SODIUM 80 MG PO TABS: 80.0000 mg | ORAL_TABLET | Freq: Every day | ORAL | Status: DC

## 2014-12-19 NOTE — Telephone Encounter (Signed)
Sent. Thanks.   

## 2014-12-19 NOTE — Progress Notes (Signed)
Electrophysiology Office Note   Date:  12/19/2014   ID:  Sergio [name], DOB September 06, 1940, MRN [id number]  PCP:  Elsie Stain, MD  Cardiologist:  Dr Johnsie Cancel Primary Electrophysiologist: Thompson Grayer, MD    Chief Complaint  Patient presents with  . Bradycardia     History of Present Illness: Sergio [name] is a 74 y.o. male who presents today for electrophysiology evaluation.   He has a h/o sinus bradycardia for which he has been asymptomatic.  He is very active and continues to exercise regularly without symptoms.  He has rare postural dizziness but otherwise no other dizziness, presyncope, or syncope.   Recently he presented for GI surgery and was noted during anesthesia induction to have sinus bradycardia with heart rates 20s-30s.  The procedure was aborted.   The patient has done well since.     Today, he denies symptoms of palpitations, chest pain, shortness of breath, orthopnea, PND, lower extremity edema, claudication, bleeding, or neurologic sequela. The patient is tolerating medications without difficulties and is otherwise without complaint today.    Past Medical History  Diagnosis Date  . Diverticulosis of colon 03/2005    via colonoscopy  . Hyperlipidemia 06/1995    started pravachol  . GERD (gastroesophageal reflux disease) 10/2005  . Arthritis     hand/finger OA  . Hyperglycemia   . Tubular adenoma of colon 04/29/10  . Plantar fasciitis of left foot     occ. none in 2 years   Past Surgical History  Procedure Laterality Date  . Circumcision  09/1999    [name]  . Palatoplasy      tonsils and adenoids/sleep apnea issue  . Septoplasty      sleep apnea  . Knee arthroscopy  04/2003    left, [name]  . Knee arthroscopy  12/2004    right, [name]  . Umbilical hernia repair  10/18/2005    [name]  . Shoulder surgery      Bilateral  . Cataract extraction, bilateral Bilateral      Current Outpatient Prescriptions  Medication Sig Dispense Refill  .  aspirin 81 MG tablet Take 81 mg by mouth daily.      . Multiple Vitamin (MULTIVITAMIN) tablet Take 1 tablet by mouth daily.      . naproxen sodium (ANAPROX) 220 MG tablet Take 220 mg by mouth 2 (two) times daily as needed (for pain).     . pravastatin (PRAVACHOL) 80 MG tablet Take 1 tablet (80 mg total) by mouth at bedtime. 30 tablet 5   No current facility-administered medications for this visit.    Allergies:   Review of patient's allergies indicates no known allergies.   Social History:  The patient  reports that he has quit smoking. His smoking use included Cigars. He has never used smokeless tobacco. He reports that he drinks about 4.2 - 9.0 oz of alcohol per week. He reports that he does not use illicit drugs.   Family History:  The patient's  family history includes Breast cancer in his mother; Diabetes in his father and mother; Multiple sclerosis in his sister; Pancreatic cancer in his mother. There is no history of Colon cancer or Prostate cancer.    ROS:  Please see the history of present illness.   All other systems are reviewed and negative.    PHYSICAL EXAM: VS:  BP 110/72 mmHg  Pulse 47  Ht 6\' 1"  (1.854 m)  Wt 242 lb 12.8 oz (110.133 kg)  BMI 32.04 kg/m2 , BMI Body mass index is 32.04 kg/(m^2). GEN: Well nourished, well developed, in no acute distress HEENT: normal Neck: no JVD, carotid bruits, or masses Cardiac: bradycardic regular rhythm; no murmurs, rubs, or gallops,no edema  Respiratory:  clear to auscultation bilaterally, normal work of breathing GI: soft, nontender, nondistended, + BS MS: no deformity or atrophy Skin: warm and dry  Neuro:  Strength and sensation are intact Psych: euthymic mood, full affect  EKG:  EKG is ordered today. The ekg ordered today shows sinus bradycardia 47 bpm   Recent Labs: 11/29/2014: ALT 25 12/04/2014: BUN 12; Creatinine, Ser 1.07; Hemoglobin 13.6; Magnesium 2.2; Platelets 131*; Potassium 4.4; Sodium 139 12/18/2014: TSH 2.479      Lipid Panel     Component Value Date/Time   CHOL 162 09/08/2013 0846   TRIG 105.0 09/08/2013 0846   TRIG 63 01/06/2006 1218   HDL 47.30 09/08/2013 0846   CHOLHDL 3 09/08/2013 0846   CHOLHDL 4.2 CALC 01/06/2006 1218   VLDL 21.0 09/08/2013 0846   LDLCALC 94 09/08/2013 0846     Wt Readings from Last 3 Encounters:  12/18/14 242 lb 12.8 oz (110.133 kg)  12/04/14 242 lb 15.2 oz (110.2 kg)  11/29/14 243 lb 4 oz (110.337 kg)      Other studies Reviewed: Additional studies/ records that were reviewed today include: hospital records and Dr Mariana Arn consult note   ASSESSMENT AND PLAN:  1.  Chronic sinus bradycardia Asymptomatic and likely due to increased resting vagal tone.  ETT reveals adequate heart rate response to exercise. No clear etiology Will check TFTs and also order a sleep study. No indication for pacing at this time.  2. preop Ok to proceed with GI surgery as previously planned I would anticipate that he will likely be bradycardic during the procedure but I do not anticipate that this should cause issues.  Could consider atropine, dobutamine, isuprel perioperatively if required though I doubt that this would be necessary.  No further EP workup planned   Follow-up:  I will see as needed going forward  Current medicines are reviewed at length with the patient today.   The patient does not have concerns regarding his medicines.  The following changes were made today:  none  Labs/ tests ordered today include:  Orders Placed This Encounter  Procedures  . TSH  . T4, free  . EKG 12-Lead  . Split night study     Signed, Thompson Grayer, MD  12/19/2014 9:44 PM     [address] [place] [address] [phone] (office) [phone] (fax)

## 2014-12-21 ENCOUNTER — Telehealth: Payer: Self-pay | Admitting: *Deleted

## 2014-12-21 NOTE — Telephone Encounter (Signed)
Pt left voicemail at Triage requesting recent pulse #'s, printed out a list of his vitals and placed them at the front for pick-up, I left voicemail letting pt know letter ready for pick up anytime we are opened

## 2014-12-22 ENCOUNTER — Other Ambulatory Visit: Payer: Self-pay | Admitting: General Surgery

## 2015-03-06 ENCOUNTER — Ambulatory Visit (HOSPITAL_BASED_OUTPATIENT_CLINIC_OR_DEPARTMENT_OTHER): Payer: PPO | Attending: Internal Medicine | Admitting: Radiology

## 2015-03-06 VITALS — Ht 73.0 in | Wt 240.0 lb

## 2015-03-06 DIAGNOSIS — G4736 Sleep related hypoventilation in conditions classified elsewhere: Secondary | ICD-10-CM | POA: Insufficient documentation

## 2015-03-06 DIAGNOSIS — R001 Bradycardia, unspecified: Secondary | ICD-10-CM | POA: Insufficient documentation

## 2015-03-06 DIAGNOSIS — G4733 Obstructive sleep apnea (adult) (pediatric): Secondary | ICD-10-CM | POA: Insufficient documentation

## 2015-03-06 DIAGNOSIS — R0683 Snoring: Secondary | ICD-10-CM | POA: Diagnosis not present

## 2015-03-11 ENCOUNTER — Encounter (HOSPITAL_BASED_OUTPATIENT_CLINIC_OR_DEPARTMENT_OTHER): Payer: Self-pay | Admitting: Radiology

## 2015-03-11 ENCOUNTER — Telehealth: Payer: Self-pay | Admitting: Cardiology

## 2015-03-11 DIAGNOSIS — G4733 Obstructive sleep apnea (adult) (pediatric): Secondary | ICD-10-CM | POA: Insufficient documentation

## 2015-03-11 HISTORY — DX: Obstructive sleep apnea (adult) (pediatric): G47.33

## 2015-03-11 NOTE — Telephone Encounter (Signed)
Please let patient know that they have sleep apnea and recommend office visit to discuss treatment options.

## 2015-03-11 NOTE — Sleep Study (Signed)
   Patient Name: Sergio [name], Sergio [name] Date: 03/06/2015 MRN: [id number] Gender: Male D.O.B: 24-May-1940 Age (years): 43 Referring Provider: Thompson Grayer Interpreting Physician: Fransico Him MD, ABSM RPSGT: Laren Everts  Weight (lbs): 240 Height (inches): 73 BMI: 32 Neck Size: 18.00  CLINICAL INFORMATION Sleep Study Type: NPSG Indication for sleep study: Obesity, OSA, Snoring, Witnessed Apneas Epworth Sleepiness Score: 6  SLEEP STUDY TECHNIQUE As per the AASM Manual for the Scoring of Sleep and Associated Events v2.3 (April 2016) with a hypopnea requiring 4% desaturations. The channels recorded and monitored were frontal, central and occipital EEG, electrooculogram (EOG), submentalis EMG (chin), nasal and oral airflow, thoracic and abdominal wall motion, anterior tibialis EMG, snore microphone, electrocardiogram, and pulse oximetry.  MEDICATIONS Patient's medications include: PRAVACHOL, ASPIRIN, ONE A DAY VITAMINS MEN, Naproxen. Medications self-administered by patient during sleep study : No sleep medicine administered.  SLEEP ARCHITECTURE The study was initiated at 11:02:28 PM and ended at 5:00:10 AM. Sleep onset time was 8.0 minutes and the sleep efficiency was reduced at 78.6%. The total sleep time was 281.0 minutes. Stage REM latency was 84.0 minutes. The patient spent 14.59% of the night in stage N1 sleep, 65.30% in stage N2 sleep, 9.25% in stage N3 and 10.85% in REM. Alpha intrusion was absent. Supine sleep was 19.93%.  RESPIRATORY PARAMETERS The overall apnea/hypopnea index (AHI) was 8.8 per hour. There were 6 total apneas, including 3 obstructive, 3 central and 0 mixed apneas. There were 35 hypopneas and 38 RERAs. The AHI during Stage REM sleep was 9.8 per hour. AHI while supine was 35.4 per hour. The mean oxygen saturation was 91.19%. The minimum SpO2 during sleep was 85.00%. Moderate snoring was noted during this study.  CARDIAC DATA The 2 lead EKG  demonstrated sinus rhythm. The mean heart rate was 56.63 beats per minute. Other EKG findings include: None.  LEG MOVEMENT DATA The total PLMS were 414 with a resulting PLMS index of 88.40. Associated arousal with leg movement index was 8.1 .  IMPRESSIONS - Mild obstructive sleep apnea occurred during this study (AHI = 8.8/h). - No significant central sleep apnea occurred during this study (CAI = 0.6/h). - Moderate oxygen desaturation was noted during this study (Min O2 = 85.00%). - The patient snored with Moderate snoring volume. - No cardiac abnormalities were noted during this study. - Severe periodic limb movements of sleep occurred during the study. Associated arousals were significant.  DIAGNOSIS - Obstructive Sleep Apnea (327.23 [G47.33 ICD-10]) - Nocturnal Hypoxemia (327.26 [G47.36 ICD-10])  RECOMMENDATIONS - Therapeutic CPAP titration to determine optimal pressure required to alleviate sleep disordered breathing.  Other treatment options include referral to ENT to evaluate for surgical causes of sleep disordered breathing and referral to dentistry to be evaluated for oral device.   - Positional therapy avoiding supine position during sleep. - Avoid alcohol, sedatives and other CNS depressants that may worsen sleep apnea and disrupt normal sleep architecture. - Sleep hygiene should be reviewed to assess factors that may improve sleep quality. - Weight management and regular exercise should be initiated or continued if appropriate. - Recommend referral to sleep clinic to discuss treatment options.   [place], American Board of Sleep Medicine  ELECTRONICALLY SIGNED ON:  03/11/2015, 4:32 PM [place] PH: [phone]   FX: [phone] [place]

## 2015-03-16 NOTE — Telephone Encounter (Signed)
Patient has been informed of information.  He is having surgery in a few weeks so he does not want to make an appointment until he has his surgery.  He said that he will call me back at the end of January to schedule the appointment because  He is very interested in the different options that might be available for him.

## 2015-03-23 ENCOUNTER — Encounter (HOSPITAL_COMMUNITY)
Admission: RE | Admit: 2015-03-23 | Discharge: 2015-03-23 | Disposition: A | Discharge status: Home / Self Care | Payer: PPO | Source: Ambulatory Visit | Attending: General Surgery | Admitting: General Surgery

## 2015-03-23 ENCOUNTER — Other Ambulatory Visit: Payer: Self-pay | Admitting: General Surgery

## 2015-03-23 ENCOUNTER — Encounter (HOSPITAL_COMMUNITY): Payer: Self-pay

## 2015-03-23 DIAGNOSIS — K219 Gastro-esophageal reflux disease without esophagitis: Secondary | ICD-10-CM | POA: Insufficient documentation

## 2015-03-23 DIAGNOSIS — R739 Hyperglycemia, unspecified: Secondary | ICD-10-CM | POA: Insufficient documentation

## 2015-03-23 DIAGNOSIS — Z87891 Personal history of nicotine dependence: Secondary | ICD-10-CM | POA: Insufficient documentation

## 2015-03-23 DIAGNOSIS — E785 Hyperlipidemia, unspecified: Secondary | ICD-10-CM | POA: Diagnosis not present

## 2015-03-23 DIAGNOSIS — G4733 Obstructive sleep apnea (adult) (pediatric): Secondary | ICD-10-CM | POA: Insufficient documentation

## 2015-03-23 DIAGNOSIS — K5792 Diverticulitis of intestine, part unspecified, without perforation or abscess without bleeding: Secondary | ICD-10-CM | POA: Diagnosis not present

## 2015-03-23 DIAGNOSIS — Z79899 Other long term (current) drug therapy: Secondary | ICD-10-CM | POA: Insufficient documentation

## 2015-03-23 DIAGNOSIS — Z0183 Encounter for blood typing: Secondary | ICD-10-CM | POA: Insufficient documentation

## 2015-03-23 DIAGNOSIS — Z7982 Long term (current) use of aspirin: Secondary | ICD-10-CM | POA: Insufficient documentation

## 2015-03-23 DIAGNOSIS — Z01818 Encounter for other preprocedural examination: Secondary | ICD-10-CM | POA: Insufficient documentation

## 2015-03-23 DIAGNOSIS — Z01812 Encounter for preprocedural laboratory examination: Secondary | ICD-10-CM | POA: Insufficient documentation

## 2015-03-23 HISTORY — DX: Adverse effect of unspecified anesthetic, initial encounter: T41.45XA

## 2015-03-23 LAB — BASIC METABOLIC PANEL
Anion gap: 11 (ref 5–15)
BUN: 15 mg/dL (ref 6–20)
CHLORIDE: 104 mmol/L (ref 101–111)
CO2: 26 mmol/L (ref 22–32)
Calcium: 9.1 mg/dL (ref 8.9–10.3)
Creatinine, Ser: 1 mg/dL (ref 0.61–1.24)
Glucose, Bld: 119 mg/dL — ABNORMAL HIGH (ref 65–99)
POTASSIUM: 5.3 mmol/L — AB (ref 3.5–5.1)
SODIUM: 141 mmol/L (ref 135–145)

## 2015-03-23 LAB — CBC
HCT: 44 % (ref 39.0–52.0)
HEMOGLOBIN: 14.8 g/dL (ref 13.0–17.0)
MCH: 30.1 pg (ref 26.0–34.0)
MCHC: 33.6 g/dL (ref 30.0–36.0)
MCV: 89.4 fL (ref 78.0–100.0)
PLATELETS: 159 10*3/uL (ref 150–400)
RBC: 4.92 MIL/uL (ref 4.22–5.81)
RDW: 12.9 % (ref 11.5–15.5)
WBC: 4.5 10*3/uL (ref 4.0–10.5)

## 2015-03-23 LAB — TYPE AND SCREEN
ABO/RH(D): A POS
ANTIBODY SCREEN: NEGATIVE

## 2015-03-23 LAB — ABO/RH: ABO/RH(D): A POS

## 2015-03-23 NOTE — Pre-Procedure Instructions (Signed)
    Sergio [name]  03/23/2015    Your procedure is scheduled on Monday, January 30.[name]  Report to [facility] Admitting at 5:30 A.M.                Your surgery or procedure is scheduled for 7:30AM   Call this number if you have problems the morning of surgery: [phone]                 For any other questions, please call [phone], Monday - Friday 8 AM - 4 PM.   Remember:  Do not eat food or drink liquids after midnight Sunday, January 29.  Take these medicines the morning of surgery with A SIP OF WATER : None               On January 23, STOP taking Aspirin, Vitamins, Naproxen.   Do not wear jewelry, make-up or nail polish.  Do not wear lotions, powders, or perfumes.     Men may shave face and neck.  Do not bring valuables to the hospital.  [facility] is not responsible for any belongings or valuables.  Contacts, dentures or bridgework may not be worn into surgery.  Leave your suitcase in the car.  After surgery it may be brought to your room.  For patients admitted to the hospital, discharge time will be determined by your treatment team.  Patients discharged the day of surgery will not be allowed to drive home.   Name and phone number of your driver:   -  Special instructions:  Review  [facility] - Preparing For Surgery.   Please read over the following fact sheets that you were given. Pain Booklet, Coughing and Deep Breathing and Surgical Site Infection Prevention

## 2015-03-23 NOTE — Progress Notes (Addendum)
I called nursing at [facility] and asked for orders to be entered in the computer. Mr. Thome is scheduled for surgery 04/02/15. Patient was in [place] for this surgery 10/16 and his HR dropped to 20- 30's, surgery was aborted.  Mr Iniguez has had a cardiac work up and has cardiac clearance for surgery.  Mr Salk had a sleep study 03/06/15, Mr Bonaccorso said it was normal, Dr Landis Gandy notes indicate some sleep apnea and she recommended CPAP.  Mr Tiegs denies any chest pain or shortness of breath.

## 2015-03-23 NOTE — Progress Notes (Signed)
error 

## 2015-03-26 NOTE — Progress Notes (Signed)
Anesthesia Chart Review:  Pt is laparoscopic assisted sigmoid colectomy, possible open on 04/02/2015 with [name].   PMH includes:  Hyperlipidemia, OSA, hyperglycemia, GERD. Former smoker. BMI 32  Pt originally scheduled for this surgery back in October. After induction of anesthesia, pt's HR dropped to 20's. He was given Robinul and his heart rate came back up into the 40s. There were no ST segment changes on the single lead EKG monitor. After a few minutes his bradycardia returned. Dr. Oletta Lamas from anesthesia and [name] agreed should cancel the case and admit the patient to the stepdown unit for cardiac workup to rule out a cardiac event.  Pt then saw Dr. Johnsie Cancel with cardiology in the hospital and Dr. Rayann Heman with EP cardiology as an outpatient. Cardiac testing below. Dr. Jackalyn Lombard notes indicate pt's chronic sinus bradycardia likely due to increased resting vagal tone. Pt cleared by Dr. Rayann Heman to have surgery, who notes "I would anticipate that he will likely be bradycardic during the procedure but I do not anticipate that this should cause issues. Could consider atropine, dobutamine, isuprel perioperatively if required though I doubt that this would be necessary."  Medications include: ASA, pravastatin.   Preoperative labs reviewed.  [name] has put in for pt to get repeat labs DOS.   1 view CXR 12/04/14: No acute cardiopulmonary abnormality  EKG 12/18/14: Marked sinus bradycardia (47 bpm).   CT cardiac 12/15/14:  1. Coronary calcium score of 352. This was 34 percentile for age and sex matched control. 2. Normal coronary origin. Right dominance. 3. Diffuse mild mixed plaque in all three coronary arteries with maximum stenosis 25-50%. An aggressive medical therapy is recommended  Exercise tolerance test 12/07/14:  - Horizontal ST segment depression ST segment depression was noted during stress in the II, III and aVF leads, and returning to baseline after less than 1 minute of  recovery.  - Arrhythmias during stress: none.  - Arrhythmias during recovery: none.  - There were no significant arrhythmias noted during the test.  - ECG was interpretable and non-conclusive.  - The patient did have horizontal ST depression at peak exercise. This improved quickly in recovery. Cannot exclude ischemia at this level of exercise. This would not however, be a high risk finding.  - He had a normal heart rate response.  Echo 12/04/14:   - Left ventricle: The cavity size was normal. Wall thickness was normal. Systolic function was normal. The estimated ejection fraction was in the range of 60% to 65%. Wall motion was normal; there were no regional wall motion abnormalities.  If no changes, I anticipate pt can proceed with surgery as scheduled.   Willeen Cass, FNP-BC [facility]/Anesthesiology Phone: [phone] 03/26/2015 1:03 PM

## 2015-03-30 NOTE — H&P (Signed)
Sergio [name]  Location: [facility] Patient #: [id number] DOB: 1940-12-08 Married / Language: English / Race: White Male   History of Present Illness    The patient is a 75 year old male who presents with diverticulitis. [name] returns for a preop visit and counseling. His wife is with him.  He was originally scheduled for elective laparoscopic-assisted sigmoid colectomy, underwent a bowel prep, and when he underwent general anesthesia on October 3 his heart rate went down to 20 and did not respond to atropine and so Dr. Oletta Lamas of anesthesia canceled the case. He has been seen by Dr. Jenkins Rouge and Dr. Rayann Heman. This is felt to be a chronic sinus bradycardia. Myocardial infarction was ruled out. Stress test was negative. Electrophysiology evaluation noted the absence of any symptoms or medications that could aggravate this. Simply thought to be due to resting vagal tone normal heart rate response to exercise. Thyroid function test apparently were okay. No indication for pacing. Dr. Rayann Heman said that it was okay to proceed with GI surgery as previously planned. He anticipated some bradycardia for the procedure but did not think it would cause issues. The advised the use of atropine, dobutamine, Isuprel perioperative low preoperatively although the data that would be necessary.  GI history reveals at least 3 episodes of acute diverticulitis, uncomplicated. The last episode required 3 to hospitalization he said the pain was severe. No complications. Last colonoscopy 2012 with a tubular adenoma and mild diverticulosis. Barium enema shows multiple diverticula concentrated in the rectosigmoid colon and very few diverticula elsewhere. He was initially sent to me by Dr. [name] and Dr. Byrd Hesselbach.  It is his choice to proceed with elective laparoscopic-assisted sigmoid colectomy, possible open. He has had no symptoms whatsoever since December 22, 2014 when I last  saw him. He knows this surgery is elective, but he is fearful of continued medical management. We have gone over the indications, details, techniques, and numerous risk of the surgery once again. We have gone over the bowel prep both mechanical and antibiotic. We will do the surgery at [facility] this time for cardiac standby if necessary. He has been thoroughly checked adenopathy this is not going to be an issue.   Allergies  No Known Drug Allergies07/25/2016  Medication History  Pravastatin Sodium (80MG  Tablet, Oral) Active. Multiple Vitamin (Oral) Active. Aspirin EC (81MG  Tablet DR, Oral) Active. Medications Reconciled  VS  Weight: 251 lb Height: 65in Body Surface Area: 2.18 m Body Mass Index: 41.77 kg/m  Temp.: 97.24F(Temporal)  Pulse: 47 (Regular)  BP: 130/80 (Sitting, Left Arm, Standard)       Physical Exam  General Mental Status-Alert. General Appearance-Not in acute distress. Build & Nutrition-Well nourished. Posture-Normal posture. Gait-Normal.  Head and Neck Head-normocephalic, atraumatic with no lesions or palpable masses. Trachea-midline. Thyroid Gland Characteristics - normal size and consistency and no palpable nodules.  Chest and Lung Exam Chest and lung exam reveals -on auscultation, normal breath sounds, no adventitious sounds and normal vocal resonance.  Cardiovascular Cardiovascular examination reveals -normal heart sounds, regular rate and rhythm with no murmurs and femoral artery auscultation bilaterally reveals normal pulses, no bruits, no thrills.  Abdomen Note: Abdomen soft and nontender. Nondistended. No mass. No hernia. Transverse scar above umbilicus from previous umbilical herniorrhaphy. No inguinal mass or adenopathy.   Neurologic Neurologic evaluation reveals -alert and oriented x 3 with no impairment of recent or remote memory, normal attention span and ability to concentrate, normal sensation and  normal coordination.  Musculoskeletal Normal Exam - Bilateral-Upper Extremity Strength Normal and Lower Extremity Strength Normal.    Assessment & Plan  DIVERTICULITIS, COLON (K57.32) Current Plans Pt Education - [facility] Diverticular Disease (AT) Pt Education - [facility] Colon Bowel Prep 2015 Miralax/Antibiotics Restarted Neomycin Sulfate 500MG , 2 (two) Tablet SEE NOTE, #6, 03/08/2015, No Refill. Local Order: TAKE TWO TABLETS AT 2 PM, 3 PM, AND 10 PM THE DAY PRIOR TO SURGERY Restarted Flagyl 500MG , 2 (two) Tablet SEE NOTE, #6, 03/08/2015, No Refill. Local Order: Take at 2pm, 3pm, and 10pm the day prior to your colon operation     You are doing well. It is good that she had had no other episodes of pain since October We have talked about the laparoscopic assisted left colectomy once again. You are still in favor of proceeding with this elective surgery, and from a medical standpoint that is appropriate because of your multiple attacks Stay on your current diet for nail Take the bowel prep as instructed Take the antibiotics the day before surgery as instructed [facility]  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 General and Minimally invasive Surgery Breast and Colorectal Surgery Office:   [phone] Pager:   [phone]

## 2015-04-01 MED ORDER — ALVIMOPAN 12 MG PO CAPS
12.0000 mg | ORAL_CAPSULE | Freq: Once | ORAL | Status: AC
  Administered 2015-04-02: 12 mg via ORAL
  Filled 2015-04-01: qty 1

## 2015-04-01 MED ORDER — DEXTROSE 5 % IV SOLN
2.0000 g | INTRAVENOUS | Status: AC
  Administered 2015-04-02: 2 g via INTRAVENOUS
  Filled 2015-04-01 (×2): qty 2

## 2015-04-01 MED ORDER — CHLORHEXIDINE GLUCONATE 4 % EX LIQD: 1.0000 "application " | Freq: Once | CUTANEOUS | Status: DC

## 2015-04-01 MED ORDER — ALVIMOPAN 12 MG PO CAPS: 12.0000 mg | ORAL_CAPSULE | Freq: Once | ORAL | Status: DC

## 2015-04-02 ENCOUNTER — Encounter (HOSPITAL_COMMUNITY)
Admission: RE | Disposition: A | Discharge status: Home / Self Care | Payer: Self-pay | Source: Ambulatory Visit | Attending: General Surgery

## 2015-04-02 ENCOUNTER — Inpatient Hospital Stay (HOSPITAL_COMMUNITY)
Admission: RE | Admit: 2015-04-02 | Discharge: 2015-04-06 | DRG: 330 | Disposition: A | Discharge status: Home / Self Care | Payer: PPO | Source: Ambulatory Visit | Attending: General Surgery | Admitting: General Surgery

## 2015-04-02 ENCOUNTER — Encounter (HOSPITAL_COMMUNITY): Payer: Self-pay | Admitting: General Practice

## 2015-04-02 ENCOUNTER — Inpatient Hospital Stay (HOSPITAL_COMMUNITY): Payer: PPO | Admitting: Certified Registered"

## 2015-04-02 ENCOUNTER — Inpatient Hospital Stay (HOSPITAL_COMMUNITY): Payer: PPO | Admitting: Vascular Surgery

## 2015-04-02 DIAGNOSIS — E785 Hyperlipidemia, unspecified: Secondary | ICD-10-CM | POA: Diagnosis present

## 2015-04-02 DIAGNOSIS — K5732 Diverticulitis of large intestine without perforation or abscess without bleeding: Secondary | ICD-10-CM | POA: Diagnosis present

## 2015-04-02 DIAGNOSIS — E669 Obesity, unspecified: Secondary | ICD-10-CM | POA: Diagnosis present

## 2015-04-02 DIAGNOSIS — Z87891 Personal history of nicotine dependence: Secondary | ICD-10-CM | POA: Diagnosis not present

## 2015-04-02 DIAGNOSIS — N400 Enlarged prostate without lower urinary tract symptoms: Secondary | ICD-10-CM | POA: Diagnosis present

## 2015-04-02 DIAGNOSIS — K219 Gastro-esophageal reflux disease without esophagitis: Secondary | ICD-10-CM | POA: Diagnosis present

## 2015-04-02 DIAGNOSIS — G4733 Obstructive sleep apnea (adult) (pediatric): Secondary | ICD-10-CM | POA: Diagnosis present

## 2015-04-02 DIAGNOSIS — Z6841 Body Mass Index (BMI) 40.0 and over, adult: Secondary | ICD-10-CM

## 2015-04-02 DIAGNOSIS — R001 Bradycardia, unspecified: Secondary | ICD-10-CM | POA: Diagnosis present

## 2015-04-02 HISTORY — PX: LAPAROSCOPIC SIGMOID COLECTOMY: SHX5928

## 2015-04-02 LAB — CBC WITH DIFFERENTIAL/PLATELET
Basophils Absolute: 0 10*3/uL (ref 0.0–0.1)
Basophils Relative: 0 %
EOS ABS: 0.1 10*3/uL (ref 0.0–0.7)
Eosinophils Relative: 2 %
HEMATOCRIT: 46 % (ref 39.0–52.0)
HEMOGLOBIN: 15.3 g/dL (ref 13.0–17.0)
LYMPHS ABS: 1.1 10*3/uL (ref 0.7–4.0)
Lymphocytes Relative: 21 %
MCH: 30.1 pg (ref 26.0–34.0)
MCHC: 33.3 g/dL (ref 30.0–36.0)
MCV: 90.4 fL (ref 78.0–100.0)
MONO ABS: 0.5 10*3/uL (ref 0.1–1.0)
MONOS PCT: 9 %
NEUTROS PCT: 68 %
Neutro Abs: 3.4 10*3/uL (ref 1.7–7.7)
Platelets: 161 10*3/uL (ref 150–400)
RBC: 5.09 MIL/uL (ref 4.22–5.81)
RDW: 13.2 % (ref 11.5–15.5)
WBC: 5.1 10*3/uL (ref 4.0–10.5)

## 2015-04-02 LAB — COMPREHENSIVE METABOLIC PANEL
ALK PHOS: 85 U/L (ref 38–126)
ALT: 24 U/L (ref 17–63)
ANION GAP: 9 (ref 5–15)
AST: 25 U/L (ref 15–41)
Albumin: 3.8 g/dL (ref 3.5–5.0)
BILIRUBIN TOTAL: 0.4 mg/dL (ref 0.3–1.2)
BUN: 11 mg/dL (ref 6–20)
CALCIUM: 9.2 mg/dL (ref 8.9–10.3)
CO2: 25 mmol/L (ref 22–32)
Chloride: 104 mmol/L (ref 101–111)
Creatinine, Ser: 1.01 mg/dL (ref 0.61–1.24)
GFR calc non Af Amer: >60 mL/min (ref >60)
Glucose, Bld: 130 mg/dL — ABNORMAL HIGH (ref 65–99)
POTASSIUM: 3.9 mmol/L (ref 3.5–5.1)
SODIUM: 138 mmol/L (ref 135–145)
TOTAL PROTEIN: 6.9 g/dL (ref 6.5–8.1)

## 2015-04-02 SURGERY — COLECTOMY, SIGMOID, LAPAROSCOPIC
Anesthesia: General | Site: Abdomen

## 2015-04-02 MED ORDER — NEOSTIGMINE METHYLSULFATE 10 MG/10ML IV SOLN
INTRAVENOUS | Status: AC
  Filled 2015-04-02: qty 1

## 2015-04-02 MED ORDER — ARTIFICIAL TEARS OP OINT
TOPICAL_OINTMENT | OPHTHALMIC | Status: DC | PRN
  Administered 2015-04-02: 1 via OPHTHALMIC

## 2015-04-02 MED ORDER — FENTANYL CITRATE (PF) 100 MCG/2ML IJ SOLN
INTRAMUSCULAR | Status: DC | PRN
  Administered 2015-04-02 (×3): 50 ug via INTRAVENOUS
  Administered 2015-04-02 (×2): 75 ug via INTRAVENOUS
  Administered 2015-04-02: 100 ug via INTRAVENOUS

## 2015-04-02 MED ORDER — ONDANSETRON HCL 4 MG/2ML IJ SOLN
INTRAMUSCULAR | Status: AC
  Filled 2015-04-02: qty 2

## 2015-04-02 MED ORDER — ACETAMINOPHEN 10 MG/ML IV SOLN
INTRAVENOUS | Status: AC
  Filled 2015-04-02: qty 100

## 2015-04-02 MED ORDER — ROCURONIUM BROMIDE 50 MG/5ML IV SOLN
INTRAVENOUS | Status: AC
  Filled 2015-04-02: qty 1

## 2015-04-02 MED ORDER — EPHEDRINE SULFATE 50 MG/ML IJ SOLN
INTRAMUSCULAR | Status: AC
  Filled 2015-04-02: qty 1

## 2015-04-02 MED ORDER — PROPOFOL 10 MG/ML IV BOLUS
INTRAVENOUS | Status: AC
  Filled 2015-04-02: qty 20

## 2015-04-02 MED ORDER — NEOSTIGMINE METHYLSULFATE 10 MG/10ML IV SOLN
INTRAVENOUS | Status: DC | PRN
  Administered 2015-04-02: 4 mg via INTRAVENOUS

## 2015-04-02 MED ORDER — ALBUMIN HUMAN 5 % IV SOLN
INTRAVENOUS | Status: DC | PRN
  Administered 2015-04-02: 10:00:00 via INTRAVENOUS

## 2015-04-02 MED ORDER — CHLORHEXIDINE GLUCONATE 0.12 % MT SOLN
15.0000 mL | Freq: Two times a day (BID) | OROMUCOSAL | Status: DC
  Administered 2015-04-02: 15 mL via OROMUCOSAL
  Filled 2015-04-02: qty 15

## 2015-04-02 MED ORDER — ONDANSETRON HCL 4 MG/2ML IJ SOLN
4.0000 mg | Freq: Four times a day (QID) | INTRAMUSCULAR | Status: DC | PRN
  Administered 2015-04-04: 4 mg via INTRAVENOUS
  Filled 2015-04-02: qty 2

## 2015-04-02 MED ORDER — CETYLPYRIDINIUM CHLORIDE 0.05 % MT LIQD
7.0000 mL | Freq: Two times a day (BID) | OROMUCOSAL | Status: DC
  Administered 2015-04-02: 7 mL via OROMUCOSAL

## 2015-04-02 MED ORDER — MIDAZOLAM HCL 5 MG/5ML IJ SOLN
INTRAMUSCULAR | Status: DC | PRN
  Administered 2015-04-02 (×2): 1 mg via INTRAVENOUS

## 2015-04-02 MED ORDER — ONDANSETRON HCL 4 MG/2ML IJ SOLN
INTRAMUSCULAR | Status: DC | PRN
  Administered 2015-04-02 (×2): 4 mg via INTRAVENOUS

## 2015-04-02 MED ORDER — LACTATED RINGERS IV SOLN
INTRAVENOUS | Status: DC | PRN
  Administered 2015-04-02 (×2): via INTRAVENOUS

## 2015-04-02 MED ORDER — PROMETHAZINE HCL 25 MG/ML IJ SOLN: 6.2500 mg | INTRAMUSCULAR | Status: DC | PRN

## 2015-04-02 MED ORDER — ONDANSETRON HCL 4 MG PO TABS
4.0000 mg | ORAL_TABLET | Freq: Four times a day (QID) | ORAL | Status: DC | PRN
  Administered 2015-04-03: 4 mg via ORAL
  Filled 2015-04-02: qty 1

## 2015-04-02 MED ORDER — PROPOFOL 10 MG/ML IV BOLUS
INTRAVENOUS | Status: DC | PRN
  Administered 2015-04-02: 160 mg via INTRAVENOUS

## 2015-04-02 MED ORDER — GLYCOPYRROLATE 0.2 MG/ML IJ SOLN
INTRAMUSCULAR | Status: AC
  Filled 2015-04-02: qty 2

## 2015-04-02 MED ORDER — ALVIMOPAN 12 MG PO CAPS
12.0000 mg | ORAL_CAPSULE | Freq: Two times a day (BID) | ORAL | Status: DC
  Administered 2015-04-03 – 2015-04-04 (×4): 12 mg via ORAL
  Filled 2015-04-02 (×4): qty 1

## 2015-04-02 MED ORDER — ACETAMINOPHEN 10 MG/ML IV SOLN
INTRAVENOUS | Status: DC | PRN
  Administered 2015-04-02: 1000 mg via INTRAVENOUS

## 2015-04-02 MED ORDER — BUPIVACAINE-EPINEPHRINE 0.5% -1:200000 IJ SOLN
INTRAMUSCULAR | Status: DC | PRN
  Administered 2015-04-02: 10 mL
  Administered 2015-04-02: 5 mL

## 2015-04-02 MED ORDER — LIDOCAINE HCL (CARDIAC) 20 MG/ML IV SOLN
INTRAVENOUS | Status: DC | PRN
  Administered 2015-04-02: 80 mg via INTRAVENOUS

## 2015-04-02 MED ORDER — PRAVASTATIN SODIUM 40 MG PO TABS
80.0000 mg | ORAL_TABLET | Freq: Every day | ORAL | Status: DC
  Administered 2015-04-02 – 2015-04-05 (×4): 80 mg via ORAL
  Filled 2015-04-02 (×4): qty 2

## 2015-04-02 MED ORDER — HYDROMORPHONE HCL 1 MG/ML IJ SOLN
INTRAMUSCULAR | Status: AC
  Filled 2015-04-02: qty 1

## 2015-04-02 MED ORDER — PANTOPRAZOLE SODIUM 40 MG IV SOLR
40.0000 mg | Freq: Every day | INTRAVENOUS | Status: DC
  Administered 2015-04-02 – 2015-04-03 (×2): 40 mg via INTRAVENOUS
  Filled 2015-04-02 (×2): qty 40

## 2015-04-02 MED ORDER — DEXTROSE 5 % IV SOLN
2.0000 g | Freq: Two times a day (BID) | INTRAVENOUS | Status: AC
  Administered 2015-04-02: 2 g via INTRAVENOUS
  Filled 2015-04-02: qty 2

## 2015-04-02 MED ORDER — HYDROMORPHONE HCL 1 MG/ML IJ SOLN
1.0000 mg | INTRAMUSCULAR | Status: DC | PRN
  Administered 2015-04-02 – 2015-04-04 (×12): 1 mg via INTRAVENOUS
  Filled 2015-04-02 (×12): qty 1

## 2015-04-02 MED ORDER — SUCCINYLCHOLINE 20MG/ML (10ML) SYRINGE FOR MEDFUSION PUMP - OPTIME
INTRAMUSCULAR | Status: DC | PRN
  Administered 2015-04-02: 120 mg via INTRAVENOUS

## 2015-04-02 MED ORDER — LIDOCAINE HCL (CARDIAC) 20 MG/ML IV SOLN
INTRAVENOUS | Status: AC
  Filled 2015-04-02: qty 5

## 2015-04-02 MED ORDER — BUPIVACAINE-EPINEPHRINE (PF) 0.25% -1:200000 IJ SOLN
INTRAMUSCULAR | Status: AC
  Filled 2015-04-02: qty 30

## 2015-04-02 MED ORDER — POTASSIUM CHLORIDE IN NACL 20-0.9 MEQ/L-% IV SOLN
INTRAVENOUS | Status: DC
  Administered 2015-04-02 – 2015-04-04 (×7): via INTRAVENOUS
  Filled 2015-04-02 (×8): qty 1000

## 2015-04-02 MED ORDER — SUCCINYLCHOLINE CHLORIDE 20 MG/ML IJ SOLN
INTRAMUSCULAR | Status: AC
  Filled 2015-04-02: qty 1

## 2015-04-02 MED ORDER — ENOXAPARIN SODIUM 40 MG/0.4ML ~~LOC~~ SOLN
40.0000 mg | SUBCUTANEOUS | Status: DC
Start: 2015-04-03 — End: 2015-04-06
  Administered 2015-04-03 – 2015-04-05 (×3): 40 mg via SUBCUTANEOUS
  Filled 2015-04-02 (×3): qty 0.4

## 2015-04-02 MED ORDER — GLYCOPYRROLATE 0.2 MG/ML IJ SOLN
INTRAMUSCULAR | Status: DC | PRN
  Administered 2015-04-02 (×2): 0.2 mg via INTRAVENOUS
  Administered 2015-04-02: 0.6 mg via INTRAVENOUS

## 2015-04-02 MED ORDER — HYDROMORPHONE HCL 1 MG/ML IJ SOLN
0.2500 mg | INTRAMUSCULAR | Status: DC | PRN
  Administered 2015-04-02 (×4): 0.5 mg via INTRAVENOUS

## 2015-04-02 MED ORDER — ROCURONIUM BROMIDE 100 MG/10ML IV SOLN
INTRAVENOUS | Status: DC | PRN
  Administered 2015-04-02: 10 mg via INTRAVENOUS
  Administered 2015-04-02: 40 mg via INTRAVENOUS
  Administered 2015-04-02: 10 mg via INTRAVENOUS

## 2015-04-02 MED ORDER — SODIUM CHLORIDE 0.9 % IR SOLN
Status: DC | PRN
  Administered 2015-04-02: 1000 mL

## 2015-04-02 MED ORDER — FENTANYL CITRATE (PF) 250 MCG/5ML IJ SOLN
INTRAMUSCULAR | Status: AC
  Filled 2015-04-02: qty 5

## 2015-04-02 MED ORDER — SODIUM CHLORIDE 0.9 % IJ SOLN
INTRAMUSCULAR | Status: AC
  Filled 2015-04-02: qty 10

## 2015-04-02 MED ORDER — OXYCODONE-ACETAMINOPHEN 5-325 MG PO TABS
1.0000 | ORAL_TABLET | ORAL | Status: DC | PRN
  Administered 2015-04-03: 2 via ORAL
  Filled 2015-04-02: qty 2

## 2015-04-02 MED ORDER — MIDAZOLAM HCL 2 MG/2ML IJ SOLN
INTRAMUSCULAR | Status: AC
  Filled 2015-04-02: qty 2

## 2015-04-02 MED ORDER — 0.9 % SODIUM CHLORIDE (POUR BTL) OPTIME
TOPICAL | Status: DC | PRN
  Administered 2015-04-02 (×3): 1000 mL

## 2015-04-02 MED ORDER — GLYCOPYRROLATE 0.2 MG/ML IJ SOLN
INTRAMUSCULAR | Status: AC
  Filled 2015-04-02: qty 3

## 2015-04-02 MED ORDER — ARTIFICIAL TEARS OP OINT
TOPICAL_OINTMENT | OPHTHALMIC | Status: AC
  Filled 2015-04-02: qty 3.5

## 2015-04-02 SURGICAL SUPPLY — 90 items
APPLIER CLIP 5 13 M/L LIGAMAX5 (MISCELLANEOUS)
APPLIER CLIP ROT 10 11.4 M/L (STAPLE)
APR CLP MED LRG 11.4X10 (STAPLE)
APR CLP MED LRG 5 ANG JAW (MISCELLANEOUS)
BLADE SURG ROTATE 9660 (MISCELLANEOUS) IMPLANT
CANISTER SUCTION 2500CC (MISCELLANEOUS) ×3 IMPLANT
CELLS DAT CNTRL 66122 CELL SVR (MISCELLANEOUS) ×1 IMPLANT
CHLORAPREP W/TINT 26ML (MISCELLANEOUS) ×3 IMPLANT
CLIP APPLIE 5 13 M/L LIGAMAX5 (MISCELLANEOUS) IMPLANT
CLIP APPLIE ROT 10 11.4 M/L (STAPLE) IMPLANT
COVER MAYO STAND STRL (DRAPES) ×6 IMPLANT
COVER SURGICAL LIGHT HANDLE (MISCELLANEOUS) ×6 IMPLANT
DRAIN CHANNEL 19F RND (DRAIN) ×2 IMPLANT
DRAPE PROXIMA HALF (DRAPES) ×5 IMPLANT
DRAPE UTILITY XL STRL (DRAPES) ×9 IMPLANT
DRAPE WARM FLUID 44X44 (DRAPE) ×3 IMPLANT
DRSG OPSITE POSTOP 4X10 (GAUZE/BANDAGES/DRESSINGS) IMPLANT
DRSG OPSITE POSTOP 4X8 (GAUZE/BANDAGES/DRESSINGS) ×2 IMPLANT
ELECT BLADE 6.5 EXT (BLADE) ×3 IMPLANT
ELECT CAUTERY BLADE 6.4 (BLADE) ×6 IMPLANT
ELECT REM PT RETURN 9FT ADLT (ELECTROSURGICAL) ×3
ELECTRODE REM PT RTRN 9FT ADLT (ELECTROSURGICAL) ×1 IMPLANT
EVACUATOR SILICONE 100CC (DRAIN) ×2 IMPLANT
GAUZE SPONGE 2X2 8PLY STRL LF (GAUZE/BANDAGES/DRESSINGS) ×1 IMPLANT
GAUZE SPONGE 4X4 12PLY STRL (GAUZE/BANDAGES/DRESSINGS) ×2 IMPLANT
GEL ULTRASOUND 20GR AQUASONIC (MISCELLANEOUS) IMPLANT
GLOVE BIO SURGEON STRL SZ7 (GLOVE) ×10 IMPLANT
GLOVE BIO SURGEON STRL SZ8 (GLOVE) ×4 IMPLANT
GLOVE BIOGEL PI IND STRL 6.5 (GLOVE) IMPLANT
GLOVE BIOGEL PI IND STRL 7.0 (GLOVE) IMPLANT
GLOVE BIOGEL PI IND STRL 8 (GLOVE) IMPLANT
GLOVE BIOGEL PI IND STRL 8.5 (GLOVE) IMPLANT
GLOVE BIOGEL PI INDICATOR 6.5 (GLOVE) ×8
GLOVE BIOGEL PI INDICATOR 7.0 (GLOVE) ×10
GLOVE BIOGEL PI INDICATOR 8 (GLOVE) ×2
GLOVE BIOGEL PI INDICATOR 8.5 (GLOVE) ×2
GLOVE ECLIPSE 8.0 STRL XLNG CF (GLOVE) ×4 IMPLANT
GLOVE EUDERMIC 7 POWDERFREE (GLOVE) ×6 IMPLANT
GOWN STRL REUS W/ TWL LRG LVL3 (GOWN DISPOSABLE) ×6 IMPLANT
GOWN STRL REUS W/ TWL XL LVL3 (GOWN DISPOSABLE) ×2 IMPLANT
GOWN STRL REUS W/TWL LRG LVL3 (GOWN DISPOSABLE) ×15
GOWN STRL REUS W/TWL XL LVL3 (GOWN DISPOSABLE) ×9
KIT BASIN OR (CUSTOM PROCEDURE TRAY) ×3 IMPLANT
KIT ROOM TURNOVER OR (KITS) ×3 IMPLANT
LEGGING LITHOTOMY PAIR STRL (DRAPES) ×3 IMPLANT
LIGASURE IMPACT 36 18CM CVD LR (INSTRUMENTS) ×3 IMPLANT
NS IRRIG 1000ML POUR BTL (IV SOLUTION) ×8 IMPLANT
PAD ARMBOARD 7.5X6 YLW CONV (MISCELLANEOUS) ×6 IMPLANT
PENCIL BUTTON HOLSTER BLD 10FT (ELECTRODE) ×6 IMPLANT
RETRACTOR WND ALEXIS 18 MED (MISCELLANEOUS) IMPLANT
RTRCTR WOUND ALEXIS 18CM MED (MISCELLANEOUS) ×3
SCALPEL HARMONIC ACE (MISCELLANEOUS) ×3 IMPLANT
SCISSORS LAP 5X35 DISP (ENDOMECHANICALS) ×3 IMPLANT
SET IRRIG TUBING LAPAROSCOPIC (IRRIGATION / IRRIGATOR) ×2 IMPLANT
SLEEVE ENDOPATH XCEL 5M (ENDOMECHANICALS) ×7 IMPLANT
SPECIMEN JAR LARGE (MISCELLANEOUS) ×3 IMPLANT
SPONGE GAUZE 2X2 STER 10/PKG (GAUZE/BANDAGES/DRESSINGS) ×2
SPONGE LAP 18X18 X RAY DECT (DISPOSABLE) ×2 IMPLANT
STAPLER CIRC CVD 29MM 37CM (STAPLE) ×2 IMPLANT
STAPLER CUT CVD 40MM GREEN (STAPLE) ×2 IMPLANT
STAPLER VISISTAT 35W (STAPLE) ×3 IMPLANT
SUCTION POOLE TIP (SUCTIONS) ×2 IMPLANT
SURGILUBE 2OZ TUBE FLIPTOP (MISCELLANEOUS) ×3 IMPLANT
SUT ETHILON 3 0 FSL (SUTURE) ×2 IMPLANT
SUT PDS AB 1 TP1 96 (SUTURE) ×4 IMPLANT
SUT PROLENE 0 CT 2 (SUTURE) ×2 IMPLANT
SUT PROLENE 2 0 CT2 30 (SUTURE) ×4 IMPLANT
SUT PROLENE 2 0 KS (SUTURE) IMPLANT
SUT SILK 2 0 (SUTURE) ×3
SUT SILK 2 0 SH CR/8 (SUTURE) ×3 IMPLANT
SUT SILK 2-0 18XBRD TIE 12 (SUTURE) ×1 IMPLANT
SUT SILK 3 0 (SUTURE) ×3
SUT SILK 3 0 SH CR/8 (SUTURE) ×3 IMPLANT
SUT SILK 3-0 18XBRD TIE 12 (SUTURE) ×1 IMPLANT
SYR BULB IRRIGATION 50ML (SYRINGE) ×3 IMPLANT
SYS LAPSCP GELPORT 120MM (MISCELLANEOUS)
SYSTEM LAPSCP GELPORT 120MM (MISCELLANEOUS) IMPLANT
TOWEL OR 17X26 10 PK STRL BLUE (TOWEL DISPOSABLE) ×6 IMPLANT
TRAY FOLEY CATH 16FRSI W/METER (SET/KITS/TRAYS/PACK) ×3 IMPLANT
TRAY LAPAROSCOPIC MC (CUSTOM PROCEDURE TRAY) ×3 IMPLANT
TRAY PROCTOSCOPIC FIBER OPTIC (SET/KITS/TRAYS/PACK) ×3 IMPLANT
TROCAR XCEL 12X100 BLDLESS (ENDOMECHANICALS) ×2 IMPLANT
TROCAR XCEL BLUNT TIP 100MML (ENDOMECHANICALS) IMPLANT
TROCAR XCEL NON-BLD 11X100MML (ENDOMECHANICALS) IMPLANT
TROCAR XCEL NON-BLD 5MMX100MML (ENDOMECHANICALS) ×3 IMPLANT
TUBE CONNECTING 12'X1/4 (SUCTIONS) ×2
TUBE CONNECTING 12X1/4 (SUCTIONS) ×4 IMPLANT
TUBING FILTER THERMOFLATOR (ELECTROSURGICAL) ×3 IMPLANT
TUBING INSUFFLATION (TUBING) ×3 IMPLANT
YANKAUER SUCT BULB TIP NO VENT (SUCTIONS) ×6 IMPLANT

## 2015-04-02 NOTE — Anesthesia Preprocedure Evaluation (Addendum)
Anesthesia Evaluation  Patient identified by MRN, date of birth, ID band Patient awake    Reviewed: Allergy & Precautions, NPO status , Patient's Chart, lab work & pertinent test results  Airway Mallampati: II  TM Distance: >3 FB Neck ROM: Full    Dental  (+) Teeth Intact   Pulmonary sleep apnea , former smoker,    breath sounds clear to auscultation       Cardiovascular  Rhythm:Regular Rate:Bradycardia  Loletha Grayer on ekg, case previously cancelled at [facility] due to sever bradycardia   Neuro/Psych    GI/Hepatic GERD  ,  Endo/Other    Renal/GU      Musculoskeletal  (+) Arthritis ,   Abdominal (+) + obese,   Peds  Hematology   Anesthesia Other Findings   Reproductive/Obstetrics                          Anesthesia Physical Anesthesia Plan  ASA: II  Anesthesia Plan: General   Post-op Pain Management:    Induction: Intravenous  Airway Management Planned: Oral ETT  Additional Equipment:   Intra-op Plan:   Post-operative Plan: Extubation in OR  Informed Consent:   Plan Discussed with:   Anesthesia Plan Comments: (Plan two IV's and dopamine)       Anesthesia Quick Evaluation

## 2015-04-02 NOTE — Anesthesia Procedure Notes (Signed)
Procedure Name: Intubation Date/Time: 04/02/2015 7:37 AM Performed by: Gaylene Brooks Pre-anesthesia Checklist: Patient identified, Timeout performed, Emergency Drugs available, Suction available and Patient being monitored Patient Re-evaluated:Patient Re-evaluated prior to inductionOxygen Delivery Method: Circle system utilized Preoxygenation: Pre-oxygenation with 100% oxygen Intubation Type: IV induction Ventilation: Mask ventilation without difficulty and Two handed mask ventilation required Laryngoscope Size: Glidescope Grade View: Grade I Tube type: Oral Tube size: 7.5 mm Number of attempts: 1 Airway Equipment and Method: Stylet and Video-laryngoscopy Placement Confirmation: ETT inserted through vocal cords under direct vision,  breath sounds checked- equal and bilateral,  positive ETCO2 and CO2 detector Secured at: 24 cm Tube secured with: Tape Dental Injury: Teeth and Oropharynx as per pre-operative assessment  Comments: After reviewing pt's last anesthetic record, decision was made to use glidescope for intubation. Smooth induction. Grade 1 view with glide, ETT easily passed. ETCO2+, BBS=.

## 2015-04-02 NOTE — Interval H&P Note (Signed)
History and Physical Interval Note:  04/02/2015 6:38 AM  Sergio [name]  has presented today for surgery, with the diagnosis of Diverticulitis  The various methods of treatment have been discussed with the patient and family. After consideration of risks, benefits and other options for treatment, the patient has consented to  Procedure(s): LAPAROSCOPIC ASSISTED SIGMOID COLECTOMY POSSIBLE OPEN (N/A) as a surgical intervention .  The patient's history has been reviewed, patient examined, no change in status, stable for surgery.  I have reviewed the patient's chart and labs.  Questions were answered to the patient's satisfaction.     Adin Hector

## 2015-04-02 NOTE — Op Note (Signed)
Patient Name:           Sergio [name]   Date of Surgery:        04/02/2015  Pre op Diagnosis:      Diverticulitis  Post op Diagnosis:    Diverticulitis  Procedure:                 Laparoscopic assisted low anterior resection with 29 mm EEA stapled anastomosis, takedown of splenic flexure laparoscopically  Surgeon:                     Edsel Petrin. [name], M.D., FACS  Assistant:                      Jackolyn Confer, M.D.  Operative Indications:   GI history reveals at least 3 episodes of acute diverticulitis, uncomplicated. The last episode required 3 days hospitalization and he said the pain was severe. No complications. Last colonoscopy 2012 with a tubular adenoma and mild diverticulosis. Barium enema shows multiple diverticula concentrated in the rectosigmoid colon and very few diverticula elsewhere. He was initially sent to me by Dr. [name] and Dr. Elsie Stain.  It is his choice to proceed with elective laparoscopic-assisted sigmoid colectomy, possible open. He has had no symptoms whatsoever since December 22, 2014 when I last saw him. He knows this surgery is elective, but he is fearful of continued medical management. We have gone over the indications, details, techniques, and numerous risk of the surgery once again. We have gone over the bowel prep both mechanical and antibiotic. We will do the surgery at [facility] this time for cardiac standby if necessary. He has been thoroughly checked adenopathy this is not going to be an issue.  Operative Findings:       The sigmoid colon was thick-walled and contains numerous diverticula, but there is no abscess.  The left ureter was visualized and preserved.  We took the splenic flexure down extensively using the harmonic scalpel and scissor dissection.  The anastomosis was created between the distal descending colon and the proximal rectum.  There was no air leak.  We had 2 complete doughnuts.  Technically it look fine.  There was no  bleeding at the completion of the case.  Procedure in Detail:          Following the induction of general endotracheal anesthesia a Foley catheter was placed and the patient was positioned in a lithotomy position with rigid padded stirrups.  The abdomen and perineum were prepped and draped in a sterile fashion.  Surgical timeout was performed.  Intravenous antibiotics were given.  0.5% Marcaine with epinephrine was used as local infiltration anesthetic.      An 11 mm Hassan trocar was placed in the midline just above the umbilicus with open technique.  Pneumoperitoneum was created.  Video camera was inserted.  I placed 4 other 5 mm trochars to allow visualization and dissection.  We started the dissection in the sigmoid colon dividing the lateral peritoneal attachments and mobilizing it medially.  We brought this off of the left iliac vessels and the left ureter.  We incised the mesenteric peritoneum of the distal sigmoid and proximal rectum both medial and laterally staying close to the colon wall using the harmonic scalpel.  I mobilized the entire descending colon and splenic flexure and brought that down.  This was somewhat tedious and took numerous steps to mobilize the splenic flexure off of the spleen.  We  took the omentum off of the left transverse colon and this helped.  Ultimately had excellent mobilization and we were able to do an anastomosis without any tension whatsoever.     I made a lower midline incision about 10 cm in length and placed a self-retaining retractor.  We eviscerated the colon.  I found that I had excellent mobilization without any tension.  I placed silk stay sutures and the proximal rectum and after creating a window in the mesentery divided the proximal rectum with the contour stapling device.  I took down the mesentery with the Harmonic scalpel and some vessels were clamped divided and ligated with 2-0 silk ties.  Good hemostasis.  When I got well above the diseased segment I  transected the distal descending colon with a knife between Allen clamps.  The specimen was marked with a silk suture to mark the proximal margin and sent to the lab.    Sizers were used.  I chose a 29 mm stapling device.  I placed the anvil in the proximal segment after placing a 0 Prolene pursestring suture and tied that down.  We had good bleeding from this segment.  I took a little bit of the fat down.  Dr. Zella Richer then went down to the rectum and dilated the rectal sphincters and then passed the stapler up the rectal stump and we had good visualization of that without any trouble.  After I was satisfied with the positioning of the stapler he opened the spike of the stapler which came through the wall of the rectal stump anterior to the staple line.  I then checked the proximal colon segment to make sure that he was not twisted and then secured it to the stapling device.  Staple device was closed, fired, opened, and removed.  We had 2 complete doughnuts.  Proctoscopy was performed and we insufflated the rectum distal: Under tension with air and there were no air bubbles.  We let the air, out.  We then changed our gowns and gloves  and redraped the patient according to our protocol.     We irrigated out the lower abdomen and pelvis.  There is no bleeding.  I placed a 49 [place] Blake drain down in the pelvis and brought that out through a left lateral trocar site sutured to the skin and connected to suction bulb.  The midline fascia and peritoneum were closed with running sutures of #1 double-stranded PDS.  The wound was irrigated and the skin was closed with skin staples.  We then reinsufflated and did a four-quadrant inspection and found no blood or blood clots and everything looked good.  The pneumoperitoneum was released.  The trochars were removed.  All skin incisions were closed with skin staples.  Clean bandages were placed and the patient taken to PACU in stable condition.  EBL 150 mL.  Counts  correct.  Complications none.                    Edsel Petrin. [name], M.D., FACS General and Minimally Invasive Surgery Breast and Colorectal Surgery  04/02/2015 10:47 AM

## 2015-04-02 NOTE — Care Management Note (Signed)
Case Management Note  Patient Details  Name: Sergio [name] MRN: [id number] Date of Birth: [birth year]  Subjective/Objective:                    Action/Plan:  Initial UR completed  Expected Discharge Date:                  Expected Discharge Plan:  Home/Self Care  In-House Referral:     Discharge planning Services     Post Acute Care Choice:    Choice offered to:     DME Arranged:    DME Agency:     HH Arranged:    [place] Agency:     Status of Service:  In process, will continue to follow  Medicare Important Message Given:    Date Medicare IM Given:    Medicare IM give by:    Date Additional Medicare IM Given:    Additional Medicare Important Message give by:     If discussed at [place] of Stay Meetings, dates discussed:    Additional Comments:  Marilu Favre, RN 04/02/2015, 2:21 PM

## 2015-04-02 NOTE — Transfer of Care (Signed)
Immediate Anesthesia Transfer of Care Note  Patient: Sergio [name]  Procedure(s) Performed: Procedure(s): LAPAROSCOPIC ASSISTED SIGMOID COLECTOMY (N/A)  Patient Location: PACU  Anesthesia Type:General  Level of Consciousness: awake, alert  and oriented  Airway & Oxygen Therapy: Patient Spontanous Breathing and Patient connected to nasal cannula oxygen  Post-op Assessment: Report given to RN, Post -op Vital signs reviewed and stable and Patient moving all extremities X 4  Post vital signs: Reviewed and stable  Last Vitals:  Filed Vitals:   04/02/15 0551  BP: 130/78  Pulse: 58  Temp: 36.4 C  Resp: 20    Complications: No apparent anesthesia complications

## 2015-04-03 ENCOUNTER — Encounter (HOSPITAL_COMMUNITY): Payer: Self-pay | Admitting: General Surgery

## 2015-04-03 LAB — BASIC METABOLIC PANEL
ANION GAP: 7 (ref 5–15)
BUN: 11 mg/dL (ref 6–20)
CHLORIDE: 104 mmol/L (ref 101–111)
CO2: 27 mmol/L (ref 22–32)
Calcium: 8.3 mg/dL — ABNORMAL LOW (ref 8.9–10.3)
Creatinine, Ser: 1 mg/dL (ref 0.61–1.24)
GFR calc Af Amer: >60 mL/min (ref >60)
GLUCOSE: 104 mg/dL — AB (ref 65–99)
POTASSIUM: 4.1 mmol/L (ref 3.5–5.1)
Sodium: 138 mmol/L (ref 135–145)

## 2015-04-03 LAB — CBC
HEMATOCRIT: 39.8 % (ref 39.0–52.0)
HEMOGLOBIN: 13.1 g/dL (ref 13.0–17.0)
MCH: 29.8 pg (ref 26.0–34.0)
MCHC: 32.9 g/dL (ref 30.0–36.0)
MCV: 90.5 fL (ref 78.0–100.0)
PLATELETS: 136 10*3/uL — AB (ref 150–400)
RBC: 4.4 MIL/uL (ref 4.22–5.81)
RDW: 13.2 % (ref 11.5–15.5)
WBC: 6.7 10*3/uL (ref 4.0–10.5)

## 2015-04-03 LAB — HEMOGLOBIN A1C
Hgb A1c MFr Bld: 5.8 % — ABNORMAL HIGH (ref 4.8–5.6)
Mean Plasma Glucose: 120 mg/dL

## 2015-04-03 NOTE — Progress Notes (Signed)
1 Day Post-Op  Subjective: Alert.  Stable.  Doing well.  No signs of bleeding. Urine clear with very good urine output No nausea.  No dyspnea. Pain control good Using incentive spirometer.  Vital capacity 2500 mL, very good.  Hemoglobin 13.1.  WBC 6700.  Potassium 4.1.  Creatinine 1.00, glucose 104.  Objective: Vital signs in last 24 hours: Temp:  [97 F (36.1 C)-99.1 F (37.3 C)] 98.9 F (37.2 C) (01/31 0507) Pulse Rate:  [56-79] 63 (01/31 0507) Resp:  [9-14] 14 (01/31 0507) BP: (113-153)/(58-83) 121/68 mmHg (01/31 0507) SpO2:  [95 %-99 %] 98 % (01/31 0507) Last BM Date: 04/02/15  Intake/Output from previous day: 01/30 0701 - 01/31 0700 In: 2836.7 [P.O.:120; I.V.:2466.7; IV Piggyback:250] Out: 1995 [id number]; Drains:220; Blood:200] Intake/Output this shift:     EXAM: General appearance: Alert.  Doesn't appear to be in any distress.  Mental status normal.  Skin warm and dry Resp: clear to auscultation bilaterally GI: Abdomen is soft.  Not distended.  Appropriate, minimal incisional tenderness.  Incisions clean and dry.  Drainage serosanguineous.  Rare bowel sounds.    Lab Results:  Results for orders placed or performed during the hospital encounter of 04/02/15 (from the past 24 hour(s))  Basic metabolic panel     Status: Abnormal   Collection Time: 04/03/15  4:40 AM  Result Value Ref Range   Sodium 138 135 - 145 mmol/L   Potassium 4.1 3.5 - 5.1 mmol/L   Chloride 104 101 - 111 mmol/L   CO2 27 22 - 32 mmol/L   Glucose, Bld 104 (H) 65 - 99 mg/dL   BUN 11 6 - 20 mg/dL   Creatinine, Ser 1.00 0.61 - 1.24 mg/dL   Calcium 8.3 (L) 8.9 - 10.3 mg/dL   GFR calc non Af Amer >60 >60 mL/min   GFR calc Af Amer >60 >60 mL/min   Anion gap 7 5 - 15  CBC     Status: Abnormal   Collection Time: 04/03/15  4:40 AM  Result Value Ref Range   WBC 6.7 4.0 - 10.5 K/uL   RBC 4.40 4.22 - 5.81 MIL/uL   Hemoglobin 13.1 13.0 - 17.0 g/dL   HCT 39.8 39.0 - 52.0 %   MCV 90.5 78.0 - 100.0  fL   MCH 29.8 26.0 - 34.0 pg   MCHC 32.9 30.0 - 36.0 g/dL   RDW 13.2 11.5 - 15.5 %   Platelets 136 (L) 150 - 400 K/uL     Studies/Results: No results found.  [name] alvimopan  12 mg Oral BID  . antiseptic oral rinse  7 mL Mouth Rinse q12n4p  . chlorhexidine  15 mL Mouth Rinse BID  . enoxaparin (LOVENOX) injection  40 mg Subcutaneous Q24H  . pantoprazole (PROTONIX) IV  40 mg Intravenous QHS  . pravastatin  80 mg Oral QHS     Assessment/Plan: s/p Procedure(s): LAPAROSCOPIC ASSISTED SIGMOID COLECTOMY  POD #1.  Laparoscopic assisted low anterior resection with takedown splenic flexure Stable Clear liquid diet entetreg Mobilize Foley out POD #2 Cut IV back  VTE prophylaxis.- Lovenox/SCDs  BMI 41 BPH Chronic sinus bradycardia, status post extensive cardiac workup  @PROBHOSP @  LOS: 1 day    Anthonia Monger M 04/03/2015  . .prob

## 2015-04-03 NOTE — Anesthesia Postprocedure Evaluation (Signed)
Anesthesia Post Note  Patient: Sergio [name]  Procedure(s) Performed: Procedure(s) (LRB): LAPAROSCOPIC ASSISTED SIGMOID COLECTOMY (N/A)  Patient location during evaluation: PACU Anesthesia Type: General Level of consciousness: awake and alert Pain management: pain level controlled Vital Signs Assessment: post-procedure vital signs reviewed and stable Respiratory status: spontaneous breathing, nonlabored ventilation, respiratory function stable and patient connected to nasal cannula oxygen Cardiovascular status: blood pressure returned to baseline and stable Postop Assessment: no signs of nausea or vomiting Anesthetic complications: no    Last Vitals:  Filed Vitals:   04/03/15 0110 04/03/15 0507  BP: 124/60 121/68  Pulse: 70 63  Temp: 37.3 C 37.2 C  Resp: 14 14    Last Pain:  Filed Vitals:   04/03/15 0853  PainSc: 5                  Bernardine Langworthy,JAMES TERRILL

## 2015-04-04 MED ORDER — HYDROCODONE-ACETAMINOPHEN 7.5-325 MG/15ML PO SOLN
10.0000 mL | ORAL | Status: DC | PRN
  Administered 2015-04-04 – 2015-04-05 (×6): 10 mL via ORAL
  Filled 2015-04-04 (×6): qty 15

## 2015-04-04 MED ORDER — KETOROLAC TROMETHAMINE 15 MG/ML IJ SOLN
15.0000 mg | Freq: Four times a day (QID) | INTRAMUSCULAR | Status: DC | PRN
  Administered 2015-04-04 – 2015-04-05 (×4): 15 mg via INTRAVENOUS
  Filled 2015-04-04 (×4): qty 1

## 2015-04-04 MED ORDER — PANTOPRAZOLE SODIUM 40 MG PO TBEC
40.0000 mg | DELAYED_RELEASE_TABLET | Freq: Every day | ORAL | Status: DC
  Administered 2015-04-04 – 2015-04-05 (×2): 40 mg via ORAL
  Filled 2015-04-04 (×2): qty 1

## 2015-04-04 MED ORDER — KETOROLAC TROMETHAMINE 15 MG/ML IJ SOLN: 15.0000 mg | Freq: Four times a day (QID) | INTRAMUSCULAR | Status: DC | PRN

## 2015-04-04 NOTE — Progress Notes (Signed)
Pt c/o sharp, occasional stabbing in rectal area. The area was examined, and there was no redness, oozing, bleeding or foreign body. [name] notified. Reassured the patient that dilation of the anus during the procedure was the probable cause of the pain. He did get good relief with Dilaudid. Will continue to monitor.

## 2015-04-04 NOTE — Progress Notes (Signed)
2 Days Post-Op  Subjective: Stable and alert.  Afebrile.  Feels a little bloated but tolerating clear liquids without nausea.  No flatus. Ambulating son.  Urine is.  Clear with good output.  Foley being removed this morning. Pathology shows benign diverticulosis and this was discussed with the patient.  Objective: Vital signs in last 24 hours: Temp:  [98.2 F (36.8 C)-98.8 F (37.1 C)] 98.5 F (36.9 C) (02/01 0640) Pulse Rate:  [58-84] 65 (02/01 0640) Resp:  [14-18] 17 (02/01 0640) BP: (108-133)/(54-72) 133/69 mmHg (02/01 0640) SpO2:  [94 %-96 %] 94 % (02/01 0640) Last BM Date: 04/02/15  Intake/Output from previous day: 01/31 0701 - 02/01 0700 In: 3460 [P.O.:960; I.V.:2500] Out: 2930 [Urine:2900; Drains:30] Intake/Output this shift:    General appearance: Alert.  Pleasant.  No distress.  Mental status normal. Resp: clear to auscultation bilaterally GI: Soft.  Minimally tender.  Few bowel sounds.  Borderline distended.  Wound looks good.  JP drainage serosanguineous, odorless.  Lab Results:  No results found for this or any previous visit (from the past 24 hour(s)).   Studies/Results: No results found.  [name] alvimopan  12 mg Oral BID  . enoxaparin (LOVENOX) injection  40 mg Subcutaneous Q24H  . pantoprazole (PROTONIX) IV  40 mg Intravenous QHS  . pravastatin  80 mg Oral QHS     Assessment/Plan: s/p Procedure(s): LAPAROSCOPIC ASSISTED SIGMOID COLECTOMY  POD #2. Laparoscopic assisted low anterior resection with takedown splenic flexure Stable Clear liquid diet entetreg Mobilize Foley out now Try Toradol to see if can reduce narcotic and reduce ileus. Cut IV back  VTE prophylaxis.- Lovenox/SCDs  BMI 41 BPH Chronic sinus bradycardia, status post extensive cardiac workup  @PROBHOSP @  LOS: 2 days    Courtland Reas M 04/04/2015  . .prob

## 2015-04-05 LAB — BASIC METABOLIC PANEL
Anion gap: 8 (ref 5–15)
BUN: 11 mg/dL (ref 6–20)
CO2: 27 mmol/L (ref 22–32)
Calcium: 8.8 mg/dL — ABNORMAL LOW (ref 8.9–10.3)
Chloride: 104 mmol/L (ref 101–111)
Creatinine, Ser: 0.92 mg/dL (ref 0.61–1.24)
GFR calc Af Amer: >60 mL/min (ref >60)
GLUCOSE: 115 mg/dL — AB (ref 65–99)
POTASSIUM: 4.4 mmol/L (ref 3.5–5.1)
Sodium: 139 mmol/L (ref 135–145)

## 2015-04-05 LAB — CBC
HCT: 37.7 % — ABNORMAL LOW (ref 39.0–52.0)
Hemoglobin: 12.7 g/dL — ABNORMAL LOW (ref 13.0–17.0)
MCH: 30.2 pg (ref 26.0–34.0)
MCHC: 33.7 g/dL (ref 30.0–36.0)
MCV: 89.8 fL (ref 78.0–100.0)
PLATELETS: 137 10*3/uL — AB (ref 150–400)
RBC: 4.2 MIL/uL — AB (ref 4.22–5.81)
RDW: 12.8 % (ref 11.5–15.5)
WBC: 4.7 10*3/uL (ref 4.0–10.5)

## 2015-04-05 MED ORDER — HYDROMORPHONE HCL 1 MG/ML IJ SOLN: 0.5000 mg | INTRAMUSCULAR | Status: DC | PRN

## 2015-04-05 NOTE — Care Management Important Message (Signed)
Important Message  Patient Details  Name: Sergio [name] MRN: [id number] Date of Birth: [birth year]   Medicare Important Message Given:  Yes    [name] 04/05/2015, 1:15 PM

## 2015-04-05 NOTE — Progress Notes (Signed)
3 Days Post-Op  Subjective: Feeling better.  Passing flatus and liquid stool a few times.  Voiding without difficulty.  Feels less bloated and less pain.  Asking to advance diet.   Objective: Vital signs in last 24 hours: Temp:  [97.1 F (36.2 C)-98.8 F (37.1 C)] 98.4 F (36.9 C) (02/02 0553) Pulse Rate:  [57-63] 63 (02/02 0553) Resp:  [18] 18 (02/02 0553) BP: (102-124)/(62-68) 116/66 mmHg (02/02 0553) SpO2:  [95 %-96 %] 96 % (02/02 0553) Last BM Date: 04/04/15  Intake/Output from previous day: 02/01 0701 - 02/02 0700 In: 1965 [P.O.:480; I.V.:1485] Out: 90 [Drains:90] Intake/Output this shift:    General appearance: Alert.  Looks like he feels much better.  Up beat and comfortable. Resp: clear to auscultation bilaterally GI: Softer.  Nontender.  Hypoactive bowel sounds.  Wounds look good.  JP drainage is serosanguineous and odorless.  Lab Results:  No results found for this or any previous visit (from the past 24 hour(s)).   Studies/Results: No results found.  . enoxaparin (LOVENOX) injection  40 mg Subcutaneous Q24H  . pantoprazole  40 mg Oral QHS  . pravastatin  80 mg Oral QHS     Assessment/Plan: s/p Procedure(s): LAPAROSCOPIC ASSISTED SIGMOID COLECTOMY  POD #3. Laparoscopic assisted low anterior resection with takedown splenic flexure Stable Advance to soft diet Discontinue entereg Mobilize Change dressing today Reduce IV and narcotics Home in 24-48 hours  VTE prophylaxis.- Lovenox/SCDs  BMI 41 BPH Chronic sinus bradycardia, status post extensive cardiac workup  @PROBHOSP @  LOS: 3 days    Kayly Kriegel M 04/05/2015  . .prob

## 2015-04-06 MED ORDER — HYDROCODONE-ACETAMINOPHEN 7.5-325 MG/15ML PO SOLN: 10.0000 mL | ORAL | Status: DC | PRN

## 2015-04-06 NOTE — Discharge Planning (Addendum)
AVS and rx to patient who verbalizes understanding, reviewed incision care for DC and dressing supply sent with pt. No questions at this time. Dc at 1020 ambulatory to private car home with all personal belongings.

## 2015-04-06 NOTE — Discharge Instructions (Signed)
-  see above 

## 2015-04-06 NOTE — Discharge Summary (Signed)
Patient ID: Sergio [name] [id number] 75 y.o. 05/17/1940  Admit date: 04/02/2015  Discharge date and time: 04/06/2015  Admitting Physician: Ernestene Mention  Discharge Physician: Ernestene Mention  Admission Diagnoses: Diverticulitis  Discharge Diagnoses: Diverticulitis                                         BMI 41                                          Benign prostatic hypertrophy without obstruction                                          Chronic sinus bradycardia, benign   Operations: Procedure(s): LAPAROSCOPIC ASSISTED low anterior resection  Admission Condition: good  Discharged Condition: good  Indication for Admission:  GI history reveals at least 3 episodes of acute diverticulitis, uncomplicated. The last episode required 3 days hospitalization and he said the pain was severe. No complications. Last colonoscopy 2012 with a tubular adenoma and mild diverticulosis. Barium enema shows multiple diverticula concentrated in the rectosigmoid colon and very few diverticula elsewhere. He was initially sent to me by Dr. [name] and Dr. Crawford Givens.  It is his choice to proceed with elective laparoscopic-assisted sigmoid colectomy, possible open. He has had no symptoms whatsoever since December 22, 2014 when I last saw him. He knows this surgery is elective, but he is fearful of continued medical management. We have gone over the indications, details, techniques, and numerous risk of the surgery once again. We have gone over the bowel prep both mechanical and antibiotic. We will do the surgery at [facility] this time for cardiac standby if necessary. He has been thoroughly checked adenopathy this is not going to be an issue.  Hospital Course: On the day of admission the patient was taken to the operating room and underwent laparoscopic-assisted low anterior resection.       Operative findings revealed that The sigmoid colon was thick-walled and contains numerous diverticula,  but there is no abscess. The left ureter was visualized and preserved. We took the splenic flexure down extensively using the harmonic scalpel and scissor dissection. The anastomosis was created between the distal descending colon and the proximal rectum. There was no air leak. We had 2 complete doughnuts. Technically it look fine. There was no bleeding at the completion of the case.      Final pathology showed diverticulosis, healthy margins, no neoplasia.        The patient progressed uneventfully.  Foley catheter was removed on postop day 2 and he had no trouble voiding.  Lovenox was used as VTE prophylaxis.  Began passing stool and flatus on postop day 2 and was tolerating liquid diet.  Continue to have a couple of loose stools and lots of flatus postop day 3 and was advanced to soft diet.  Tolerated soft diet without difficulty and felt good.  I felt that he met discharge criteria on postop day 4.      On the day of discharge he looked good was feeling well.  Abdomen soft.  Nondistended.  Nontender.  Good bowel sounds.  All incisions look good.  JP drainage  was thin, serosanguineous, odorless.  The JP drainage was removed by the nursing staff.  The staples were left in place.           He was given a prescription for hydrocodone today.  He was told to continue all his usual medications.  Hydration was emphasized.  Diet and activities discussed.  He was asked to return to see me in the office in one week for staple removal and wound check.     Consults: None  Significant Diagnostic Studies: Surgical pathology, revealing benign diverticulosis.    Treatments: surgery: Laparoscopic assisted low anterior resection   Disposition: Home  Patient Instructions:    Medication List    TAKE these medications        aspirin 81 MG tablet  Take 81 mg by mouth daily.     HYDROcodone-acetaminophen 7.5-325 mg/15 ml solution  Commonly known as:  HYCET  Take 10 mLs by mouth every 4 (four) hours as  needed for moderate pain.     multivitamin tablet  Take 1 tablet by mouth daily.     naproxen sodium 220 MG tablet  Commonly known as:  ANAPROX  Take 440 mg by mouth 2 (two) times daily as needed (for pain).     pravastatin 80 MG tablet  Commonly known as:  PRAVACHOL  Take 1 tablet (80 mg total) by mouth at bedtime.        Activity: Unlimited ambulation.  May walk up and down stairs.  No sports or lifting more than 20 pounds for 5 weeks. Diet: low fat, low cholesterol diet Wound Care: as directed  Follow-up:  With [name] in 5-7 days.  Signed: Edsel Petrin. [name], M.D., FACS General and minimally invasive surgery Breast and Colorectal Surgery  04/06/2015, 6:36 AM

## 2015-04-10 ENCOUNTER — Ambulatory Visit: Payer: PPO | Admitting: Family Medicine

## 2015-04-13 ENCOUNTER — Encounter: Payer: Self-pay | Admitting: Gastroenterology

## 2015-05-04 ENCOUNTER — Telehealth: Payer: Self-pay

## 2015-05-04 NOTE — Telephone Encounter (Signed)
Pt left v/m; pt wants to decrease cholesterol by half; pt last annual exam on 09/15/13. Pt request cb. Would pt need CPX or would office visit with labs be sufficient.Please advise.

## 2015-05-06 MED ORDER — PRAVASTATIN SODIUM 80 MG PO TABS: 40.0000 mg | ORAL_TABLET | Freq: Every day | ORAL | Status: DC

## 2015-05-06 NOTE — Telephone Encounter (Signed)
Due for CPE.  Go ahead and cut pravastatin in half and recheck labs in about 2 months before a CPE.  Thanks .

## 2015-05-07 NOTE — Telephone Encounter (Signed)
Patient advised.   Patient will call back for CPE and lab appt.

## 2015-06-12 ENCOUNTER — Ambulatory Visit (INDEPENDENT_AMBULATORY_CARE_PROVIDER_SITE_OTHER): Payer: PPO | Admitting: Gastroenterology

## 2015-06-12 ENCOUNTER — Encounter: Payer: Self-pay | Admitting: Gastroenterology

## 2015-06-12 VITALS — BP 110/70 | HR 64 | Ht 73.0 in | Wt 240.8 lb

## 2015-06-12 DIAGNOSIS — Z86010 Personal history of colonic polyps: Secondary | ICD-10-CM | POA: Diagnosis not present

## 2015-06-12 DIAGNOSIS — K5732 Diverticulitis of large intestine without perforation or abscess without bleeding: Secondary | ICD-10-CM

## 2015-06-12 NOTE — Progress Notes (Signed)
[facility] Progress Note  Chief Complaint: Diverticulitis  Subjective History:  This is a 75 year old man who establish care with me today. He previously saw Dr. Olevia Perches, who saw him for recurrent diverticulitis. Sergio [name] underwent a sigmoid colectomy in January 2017 by [name], and has felt very well since then. He has no recurrence of left lower quadrant pain or diverticulitis. He denies rectal bleeding, anorexia or weight loss. He denies family history of colon cancer. Sergio [name] had a tubular adenoma removed in February 2012.   ROS: Cardiovascular:  no chest pain Respiratory: no dyspnea  The patient's Past Medical, Family and Social History were reviewed and are on file in the EMR.  Objective:  Med list reviewed  Vital signs in last 24 hrs: Filed Vitals:   06/12/15 1011  BP: 110/70  Pulse: 64    Physical Exam   HEENT: sclera anicteric, oral mucosa moist without lesions  Neck: supple, no thyromegaly, JVD or lymphadenopathy  Cardiac: RRR without murmurs, S1S2 heard, no peripheral edema  Pulm: clear to auscultation bilaterally, normal RR and effort noted  Abdomen: soft, No tenderness, with active bowel sounds. No guarding or palpable hepatosplenomegaly.  Skin; warm and dry, no jaundice or rash      @ASSESSMENTPLANBEGIN @ Assessment: Encounter Diagnoses  Name Primary?  . Diverticulitis of colon without hemorrhage Yes  . Personal history of colonic polyps     Diverticulitis is now resolved after surgery.  Plan:  Surveillance colonoscopy  Nelida Meuse III

## 2015-06-12 NOTE — Patient Instructions (Signed)
You have been scheduled for a colonoscopy. Please follow written instructions given to you at your visit today.  Please pick up your prep supplies at the pharmacy within the next 1-3 days. If you use inhalers (even only as needed), please bring them with you on the day of your procedure. Your physician has requested that you go to www.startemmi.com and enter the access code given to you at your visit today. This web site gives a general overview about your procedure. However, you should still follow specific instructions given to you by our office regarding your preparation for the procedure.  If you are age 59 or older, your body mass index should be between 23-30. Your Body mass index is 31.78 kg/(m^2). If this is out of the aforementioned range listed, please consider follow up with your Primary Care Provider.  If you are age 77 or younger, your body mass index should be between 19-25. Your Body mass index is 31.78 kg/(m^2). If this is out of the aformentioned range listed, please consider follow up with your Primary Care Provider.   Thank you for choosing [facility]  Dr Wilfrid Lund III

## 2015-06-19 NOTE — Progress Notes (Signed)
Patient ID: Sergio [name], male   DOB: [birth year], 75 y.o.   MRN: [id number]     Cardiology Office Note   Date:  06/22/2015   ID:  Sergio [name], DOB [birth year], MRN [id number]  PCP:  Elsie Stain, MD  Cardiologist:   Jenkins Rouge, MD   Chief Complaint  Patient presents with  . Bradycardia      History of Present Illness: Sergio [name] is a 75 y.o. male who presents for f/u bradycardia.  Seen 10/16 in hospital  No prior history of CAD, admitted for colectomy for diverticulitis and developed bradycardia in OR. Following induction of anesthesia the patient developed bradycardia down into the 20s.He was given Robinul and his heart rate came back up into the 40s.There were no ST segment changes on the single lead EKG monitor. After a few minutes his bradycardia returned. His surgery was never started, it appears the bradycardia was with induction of anesthesia. The pt has no history of syncope. He tells me his HR has always been a little slow. An EKG from 2011 showed NSR, SB in the 50s. He is very active, runs/jogs/ pre cor, 3-4 times a week without chest pain or unusual dyspnea.   F/U ETT 12/07/14 showed excellent exercise tolerance and HR 157 no ischemia Echo 12/04/14  Normal EF 60-65%  Seen by [facility] and no pacer indicated   [id number] had uncomplicated lap colectomy with Dr [name]    Past Medical History  Diagnosis Date  . Diverticulosis of colon 03/2005    via colonoscopy  . Hyperlipidemia 06/1995    started pravachol  . Arthritis     hand/finger OA  . Hyperglycemia   . Tubular adenoma of colon 04/29/10  . Plantar fasciitis of left foot     occ. none in 2 years  . OSA (obstructive sleep apnea) 03/11/2015  . Complication of anesthesia 2016    Heart Rate dropped to 30's  . GERD (gastroesophageal reflux disease) 10/2005    03/23/15- not current  . Cataract   . Sleep apnea     Past Surgical History  Procedure Laterality Date  . Circumcision  09/1999    [name]  .  Palatoplasy      tonsils and adenoids/sleep apnea issue  . Septoplasty      sleep apnea  . Knee arthroscopy Right 12/2004    right, [name]- [facility]  . Umbilical hernia repair  10/18/2005    [name]  . Shoulder surgery Bilateral     Bone Supurs, Bone Spurs "Plus"  . Cataract extraction, bilateral Bilateral   . Uvavlaectomy    . Laparoscopic sigmoid colectomy N/A 04/02/2015    Procedure: LAPAROSCOPIC ASSISTED SIGMOID COLECTOMY;  Surgeon: Fanny Skates, MD;  Location: [place];  Service: General;  Laterality: N/A;     Current Outpatient Prescriptions  Medication Sig Dispense Refill  . aspirin 81 MG tablet Take 81 mg by mouth daily.      . Multiple Vitamin (MULTIVITAMIN) tablet Take 1 tablet by mouth daily.      . pravastatin (PRAVACHOL) 40 MG tablet      No current facility-administered medications for this visit.    Allergies:   Review of patient's allergies indicates no known allergies.    Social History:  The patient  reports that he has quit smoking. His smoking use included Cigars. He has never used smokeless tobacco. He reports that he drinks about 12.6 - 17.4 oz of alcohol per week. He reports that he  does not use illicit drugs.   Family History:  The patient's family history includes Breast cancer in his mother; Diabetes in his father and mother; Multiple sclerosis in his sister; Pancreatic cancer in his mother. There is no history of Colon cancer or Prostate cancer.    ROS:  Please see the history of present illness.   Otherwise, review of systems are positive for none.   All other systems are reviewed and negative.    PHYSICAL EXAM: VS:  BP 108/60 mmHg  Pulse 54  Resp 16  Ht 6\' 1"  (1.854 m)  Wt 108.41 kg (239 lb)  BMI 31.54 kg/m2  SpO2 95% , BMI Body mass index is 31.54 kg/(m^2). Affect appropriate Healthy:  appears stated age 58: normal Neck supple with no adenopathy JVP normal no bruits no thyromegaly Lungs clear with no wheezing and good diaphragmatic  motion Heart:  S1/S2 no murmur, no rub, gallop or click PMI normal Abdomen: benighn, BS positve, post lap colectomy and mid line lower abdominal scar  no bruit.  No HSM or HJR Distal pulses intact with no bruits No edema Neuro non-focal Skin warm and dry No muscular weakness    EKG:  12/18/14  SB rate 47 otherwise normal    Recent Labs: 12/04/2014: Magnesium 2.2 12/18/2014: TSH 2.479 04/02/2015: ALT 24 04/05/2015: BUN 11; Creatinine, Ser 0.92; Hemoglobin 12.7*; Platelets 137*; Potassium 4.4; Sodium 139    Lipid Panel    Component Value Date/Time   CHOL 162 09/08/2013 0846   TRIG 105.0 09/08/2013 0846   TRIG 63 01/06/2006 1218   HDL 47.30 09/08/2013 0846   CHOLHDL 3 09/08/2013 0846   CHOLHDL 4.2 CALC 01/06/2006 1218   VLDL 21.0 09/08/2013 0846   LDLCALC 94 09/08/2013 0846      Wt Readings from Last 3 Encounters:  06/22/15 108.41 kg (239 lb)  06/20/15 108.863 kg (240 lb)  06/12/15 109.226 kg (240 lb 12.8 oz)      Other studies Reviewed: Additional studies/ records that were reviewed today include: [facility] consult note Echo ETT and op note Dr [name] .    ASSESSMENT AND PLAN:  1.  Bradycardia chronic high resting vagal tone normal chronotropic response and normal echo observe 2. Chol:  Labs with Elsie Stain Lab Results  Component Value Date   [place] 94 09/08/2013    3. Diverticulitis normal post op course f/u Dr [name]    Current medicines are reviewed at length with the patient today.  The patient does not have concerns regarding medicines.  The following changes have been made:  no change  Labs/ tests ordered today include: none  No orders of the defined types were placed in this encounter.     Disposition:   FU with me in a year with ECG      Signed, Jenkins Rouge, MD  06/22/2015 9:54 AM    [facility] [address], [place], [place]  [address] Phone: [phone]; Fax: [phone]

## 2015-06-20 ENCOUNTER — Ambulatory Visit (AMBULATORY_SURGERY_CENTER): Payer: PPO | Admitting: Gastroenterology

## 2015-06-20 ENCOUNTER — Encounter: Payer: Self-pay | Admitting: Gastroenterology

## 2015-06-20 VITALS — BP 106/66 | HR 53 | Temp 97.1°F | Resp 12 | Ht 73.0 in | Wt 240.0 lb

## 2015-06-20 DIAGNOSIS — D122 Benign neoplasm of ascending colon: Secondary | ICD-10-CM

## 2015-06-20 DIAGNOSIS — Z86010 Personal history of colonic polyps: Secondary | ICD-10-CM

## 2015-06-20 DIAGNOSIS — K5732 Diverticulitis of large intestine without perforation or abscess without bleeding: Secondary | ICD-10-CM

## 2015-06-20 MED ORDER — SODIUM CHLORIDE 0.9 % IV SOLN: 500.0000 mL | INTRAVENOUS | Status: DC

## 2015-06-20 NOTE — Progress Notes (Signed)
Report to PACU, RN, vss, BBS= Clear.  

## 2015-06-20 NOTE — Progress Notes (Signed)
No problems noted in the recovery room. maw 

## 2015-06-20 NOTE — Progress Notes (Signed)
Called to room to assist during endoscopic procedure.  Patient ID and intended procedure confirmed with present staff. Received instructions for my participation in the procedure from the performing physician.  

## 2015-06-20 NOTE — Patient Instructions (Signed)
YOU HAD AN ENDOSCOPIC PROCEDURE TODAY AT THE [facility]:   Refer to the procedure report that was given to you for any specific questions about what was found during the examination.  If the procedure report does not answer your questions, please call your gastroenterologist to clarify.  If you requested that your care partner not be given the details of your procedure findings, then the procedure report has been included in a sealed envelope for you to review at your convenience later.  YOU SHOULD EXPECT: Some feelings of bloating in the abdomen. Passage of more gas than usual.  Walking can help get rid of the air that was put into your GI tract during the procedure and reduce the bloating. If you had a lower endoscopy (such as a colonoscopy or flexible sigmoidoscopy) you may notice spotting of blood in your stool or on the toilet paper. If you underwent a bowel prep for your procedure, you may not have a normal bowel movement for a few days.  Please Note:  You might notice some irritation and congestion in your nose or some drainage.  This is from the oxygen used during your procedure.  There is no need for concern and it should clear up in a day or so.  SYMPTOMS TO REPORT IMMEDIATELY:   Following lower endoscopy (colonoscopy or flexible sigmoidoscopy):  Excessive amounts of blood in the stool  Significant tenderness or worsening of abdominal pains  Swelling of the abdomen that is new, acute  Fever of 100F or higher   For urgent or emergent issues, a gastroenterologist can be reached at any hour by calling [phone].   DIET: Your first meal following the procedure should be a small meal and then it is ok to progress to your normal diet. Heavy or fried foods are harder to digest and may make you feel nauseous or bloated.  Likewise, meals heavy in dairy and vegetables can increase bloating.  Drink plenty of fluids but you should avoid alcoholic beverages for 24  hours.  ACTIVITY:  You should plan to take it easy for the rest of today and you should NOT DRIVE or use heavy machinery until tomorrow (because of the sedation medicines used during the test).    FOLLOW UP: Our staff will call the number listed on your records the next business day following your procedure to check on you and address any questions or concerns that you may have regarding the information given to you following your procedure. If we do not reach you, we will leave a message.  However, if you are feeling well and you are not experiencing any problems, there is no need to return our call.  We will assume that you have returned to your regular daily activities without incident.  If any biopsies were taken you will be contacted by phone or by letter within the next 1-3 weeks.  Please call us at [phone] if you have not heard about the biopsies in 3 weeks.    SIGNATURES/CONFIDENTIALITY: You and/or your care partner have signed paperwork which will be entered into your electronic medical record.  These signatures attest to the fact that that the information above on your After Visit Summary has been reviewed and is understood.  Full responsibility of the confidentiality of this discharge information lies with you and/or your care-partner.    Handouts were given to your care partner on polyps, diverticulosis, hemorrhoids, and a high fiber diet with liberal fluid intake.  You   may resume your current medications today. Await biopsy results. Please call if any questions or concerns.   

## 2015-06-20 NOTE — Op Note (Signed)
Sergio [name] Procedure Date: 06/20/2015 10:31 AM MRN: [id number] Endoscopist: Mallie Mussel L. Sergio [name] , MD Age: 75 Date of Birth: 1940/08/29 Gender: Male Procedure:                Colonoscopy Indications:              Last colonoscopy 5 years ago Medicines:                Monitored Anesthesia Care Procedure:                Pre-Anesthesia Assessment:                           - Prior to the procedure, a History and Physical                            was performed, and patient medications and                            allergies were reviewed. The patient's tolerance of                            previous anesthesia was also reviewed. The risks                            and benefits of the procedure and the sedation                            options and risks were discussed with the patient.                            All questions were answered, and informed consent                            was obtained. Prior Anticoagulants: The patient has                            taken no previous anticoagulant or antiplatelet                            agents. ASA Grade Assessment: II - A patient with                            mild systemic disease. After reviewing the risks                            and benefits, the patient was deemed in                            satisfactory condition to undergo the procedure.                           After obtaining informed consent, the colonoscope  was passed under direct vision. Throughout the                            procedure, the patient's blood pressure, pulse, and                            oxygen saturations were monitored continuously. The                            Model CF-HQ190L [id number]) scope was introduced                            through the anus and advanced to the the cecum,                            identified by appendiceal orifice and ileocecal                             valve. The quality of the bowel preparation was                            good. The ileocecal valve, appendiceal orifice, and                            rectum were photographed. The bowel preparation                            used was Miralax. Scope In: 10:38:51 AM Scope Out: 10:52:26 AM Scope Withdrawal Time: 0 hours 10 minutes 14 seconds  Total Procedure Duration: 0 hours 13 minutes 35 seconds  Findings:                 The perianal and digital rectal examinations were                            normal.                           A 5 mm polyp was found in the proximal ascending                            colon. The polyp was sessile. The polyp was removed                            with a cold snare. Resection and retrieval were                            complete.                           Multiple medium-mouthed diverticula were found in                            the descending colon.  There was evidence of a prior end-to-end                            colo-colonic anastomosis in the distal sigmoid                            colon. This was patent and was characterized by an                            intact staple line.                           Internal hemorrhoids were found during                            retroflexion. The hemorrhoids were Grade I                            (internal hemorrhoids that do not prolapse).                           The exam was otherwise without abnormality. Complications:            No immediate complications. Estimated Blood Loss:     Estimated blood loss: none. Impression:               - One 5 mm polyp in the proximal ascending colon,                            removed with a cold snare. Resected and retrieved.                           - Diverticulosis in the descending colon.                           - Patent end-to-end colo-colonic anastomosis,                            characterized by an intact staple  line.                           - Internal hemorrhoids.                           - The examination was otherwise normal. Recommendation:           - Patient has a contact number available for                            emergencies. The signs and symptoms of potential                            delayed complications were discussed with the                            patient. Return to normal activities tomorrow.  Written discharge instructions were provided to the                            patient.                           - Resume previous diet.                           - Continue present medications.                           - Await pathology results.                           - Repeat colonoscopy is recommended for                            surveillance. The colonoscopy date will be                            determined after pathology results from today's                            exam become available for review. Henry L. Sergio Carrow, MD 06/20/2015 10:57:31 AM This report has been signed electronically.

## 2015-06-21 ENCOUNTER — Telehealth: Payer: Self-pay

## 2015-06-21 NOTE — Telephone Encounter (Signed)
  Follow up Call-  Call back number 06/20/2015  Post procedure Call Back phone  # [phone]  Permission to leave phone message Yes     Patient questions:  Do you have a fever, pain , or abdominal swelling? No. Pain Score  0 *  Have you tolerated food without any problems? Yes.    Have you been able to return to your normal activities? Yes.    Do you have any questions about your discharge instructions: Diet   No. Medications  No. Follow up visit  No.  Do you have questions or concerns about your Care? No.  Actions: * If pain score is 4 or above: No action needed, pain <4.

## 2015-06-22 ENCOUNTER — Encounter: Payer: Self-pay | Admitting: Cardiovascular Disease

## 2015-06-22 ENCOUNTER — Ambulatory Visit (INDEPENDENT_AMBULATORY_CARE_PROVIDER_SITE_OTHER): Payer: PPO | Admitting: Cardiovascular Disease

## 2015-06-22 VITALS — BP 108/60 | HR 54 | Resp 16 | Ht 73.0 in | Wt 239.0 lb

## 2015-06-22 DIAGNOSIS — R001 Bradycardia, unspecified: Secondary | ICD-10-CM

## 2015-06-22 NOTE — Patient Instructions (Signed)

## 2015-06-25 ENCOUNTER — Encounter: Payer: Self-pay | Admitting: Gastroenterology

## 2015-07-05 ENCOUNTER — Ambulatory Visit (INDEPENDENT_AMBULATORY_CARE_PROVIDER_SITE_OTHER): Payer: PPO | Admitting: Cardiology

## 2015-07-05 ENCOUNTER — Encounter: Payer: Self-pay | Admitting: Cardiology

## 2015-07-05 VITALS — BP 112/66 | HR 58 | Ht 73.0 in | Wt 243.8 lb

## 2015-07-05 DIAGNOSIS — I9789 Other postprocedural complications and disorders of the circulatory system, not elsewhere classified: Secondary | ICD-10-CM | POA: Diagnosis not present

## 2015-07-05 DIAGNOSIS — G4733 Obstructive sleep apnea (adult) (pediatric): Secondary | ICD-10-CM

## 2015-07-05 NOTE — Patient Instructions (Signed)
Medication Instructions:  Your physician recommends that you continue on your current medications as directed. Please refer to the Current Medication list given to you today.   Labwork: None  Testing/Procedures: None  Follow-Up: You have been referred to Dr. Toy Cookey for evaluation for oral device.  Your physician wants you to follow-up in: 6 months with Dr. Radford Pax. You will receive a reminder letter in the mail two months in advance. If you don't receive a letter, please call our office to schedule the follow-up appointment.   Any Other Special Instructions Will Be Listed Below (If Applicable).     If you need a refill on your cardiac medications before your next appointment, please call your pharmacy.

## 2015-07-05 NOTE — Progress Notes (Addendum)
Cardiology Office Note    Date:  07/05/2015   ID:  Sergio [name], DOB 15-Aug-1940, MRN [id number]  PCP:  Elsie Stain, MD  Cardiologist:  Sueanne Margarita, MD   Chief Complaint  Patient presents with  . Committee Review    osa    History of Present Illness:  Sergio [name] is a 75 y.o. male with a history of bradycardia during surgery was referred for sleep study due rule out sleep apnea.  He has a history of obesity, snoring and remote history of witnessed apnea during sleep.  He underwent PSG showing mild OSA with an AHI of 8.8/hr and no central sleep apnea.  His oxygen saturations dropped to 85% transiently and there was moderate snoring.  He says that he feel rested when he wakes up but occasionally does get sleepy during the day and will take a nap.      Past Medical History  Diagnosis Date  . Diverticulosis of colon 03/2005    via colonoscopy  . Hyperlipidemia 06/1995    started pravachol  . Arthritis     hand/finger OA  . Hyperglycemia   . Tubular adenoma of colon 04/29/10  . Plantar fasciitis of left foot     occ. none in 2 years  . OSA (obstructive sleep apnea) 03/11/2015  . Complication of anesthesia 2016    Heart Rate dropped to 30's  . GERD (gastroesophageal reflux disease) 10/2005    03/23/15- not current  . Cataract   . Sleep apnea     Past Surgical History  Procedure Laterality Date  . Circumcision  09/1999    [name]  . Palatoplasy      tonsils and adenoids/sleep apnea issue  . Septoplasty      sleep apnea  . Knee arthroscopy Right 12/2004    right, [name]- [facility]  . Umbilical hernia repair  10/18/2005    [name]  . Shoulder surgery Bilateral     Bone Supurs, Bone Spurs "Plus"  . Cataract extraction, bilateral Bilateral   . Uvavlaectomy    . Laparoscopic sigmoid colectomy N/A 04/02/2015    Procedure: LAPAROSCOPIC ASSISTED SIGMOID COLECTOMY;  Surgeon: Fanny Skates, MD;  Location: [place];  Service: General;  Laterality: N/A;    Current  Medications: Outpatient Prescriptions Prior to Visit  Medication Sig Dispense Refill  . aspirin 81 MG tablet Take 81 mg by mouth daily.      . Multiple Vitamin (MULTIVITAMIN) tablet Take 1 tablet by mouth daily.      . pravastatin (PRAVACHOL) 40 MG tablet Take 40 mg by mouth daily.      No facility-administered medications prior to visit.     Allergies:   Review of patient's allergies indicates no known allergies.   Social History   Social History  . Marital Status: Married    Spouse Name: N/A  . Number of Children: 2  . Years of Education: N/A   Occupational History  . 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 Smoking status: Former Smoker    Types: Cigars  . Smokeless tobacco: Never Used     Comment: smoked cigars rarely-none in several years  . Alcohol Use: 12.6 - 17.4 oz/week    14 Glasses of wine, 7-15 Standard drinks or equivalent per week     Comment: daily-wine usually  . Drug Use: No  . Sexual Activity: Yes   Other Topics Concern  . 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 There is no history of Colon cancer or Prostate cancer.   ROS:   Please see the history of present illness.    ROS All other systems reviewed and are negative.   PHYSICAL EXAM:   VS:  BP 112/66 mmHg  Pulse 58  Ht 6\' 1"  (1.854 m)  Wt 243 lb 12.8 oz (110.587 kg)  BMI 32.17 kg/m2   GEN: Well nourished, well developed, in no acute distress HEENT: normal Neck: no JVD, carotid bruits, or masses Cardiac: RRR; no murmurs, rubs, or gallops,no edema.  Intact distal pulses bilaterally.  Respiratory:  clear to  auscultation bilaterally, normal work of breathing GI: soft, nontender, nondistended, + BS MS: no deformity or atrophy Skin: warm and dry, no rash Neuro:  Alert and Oriented x 3, Strength and sensation are intact Psych: euthymic mood, full affect  Wt Readings from Last 3 Encounters:  07/05/15 243 lb 12.8 oz (110.587 kg)  06/22/15 239 lb (108.41 kg)  06/20/15 240 lb (108.863 kg)      Studies/Labs Reviewed:   EKG:  EKG is not ordered today.    Recent Labs: 12/04/2014: Magnesium 2.2 12/18/2014: TSH 2.479 04/02/2015: ALT 24 04/05/2015: BUN 11; Creatinine, Ser 0.92; Hemoglobin 12.7*; Platelets 137*; Potassium 4.4; Sodium 139   Lipid Panel    Component Value Date/Time   CHOL 162 09/08/2013 0846   TRIG 105.0 09/08/2013 0846   TRIG 63 01/06/2006 1218   HDL 47.30 09/08/2013 0846   CHOLHDL 3 09/08/2013 0846   CHOLHDL 4.2 CALC 01/06/2006 1218   VLDL 21.0 09/08/2013 0846   LDLCALC 94 09/08/2013 0846    Additional studies/ records that were reviewed today include:  Sleep study    ASSESSMENT:    1. OSA (obstructive sleep apnea)   2. Bradycardia following surgery      PLAN:  In order of problems listed above:  1. Mild OSA with mild oxygen desaturations with O2 sats < 88% for 7 minutes.  He has no significant daytime sleepiness so I do not think that CPAP is recommended at this time given the very mild degree of apnea.  I have recommended that we refer him to Dr. Toy Cookey with dentistry for evaluation for oral device.  I have also instructed the patient on proper sleep hygiene, avoidance of sleeping in the supine position and avoidance of alcohol within 4 hours of bedtime.  The patient was also instructed to avoid driving if sleepy.   2. Bradycardia post op    Medication Adjustments/Labs and Tests Ordered: Current medicines are reviewed at length with the patient today.  Concerns regarding medicines are outlined above.  Medication changes, Labs and Tests ordered today are listed  in the Patient Instructions below.  Patient Instructions  Medication Instructions:  Your physician recommends that you continue on your current medications as directed. Please refer to the Current Medication list given to you today.   Labwork: None  Testing/Procedures: None  Follow-Up: You have been referred to Dr. Toy Cookey for evaluation for oral device.  Your physician wants you to follow-up in: 6  months with Dr. Radford Pax. You will receive a reminder letter in the mail two months in advance. If you don't receive a letter, please call our office to schedule the follow-up appointment.   Any Other Special Instructions Will Be Listed Below (If Applicable).     If you need a refill on your cardiac medications before your next appointment, please call your pharmacy.       Lurena Nida, MD  07/05/2015 9:25 AM    [facility] [place], [place], [place]  [address] Phone: [phone]; Fax: [phone]

## 2015-07-09 ENCOUNTER — Other Ambulatory Visit: Payer: Self-pay | Admitting: Family Medicine

## 2015-07-09 DIAGNOSIS — R739 Hyperglycemia, unspecified: Secondary | ICD-10-CM

## 2015-07-09 DIAGNOSIS — E785 Hyperlipidemia, unspecified: Secondary | ICD-10-CM

## 2015-07-10 ENCOUNTER — Other Ambulatory Visit: Payer: PPO

## 2015-07-16 ENCOUNTER — Other Ambulatory Visit (INDEPENDENT_AMBULATORY_CARE_PROVIDER_SITE_OTHER): Payer: PPO

## 2015-07-16 DIAGNOSIS — E785 Hyperlipidemia, unspecified: Secondary | ICD-10-CM | POA: Diagnosis not present

## 2015-07-16 DIAGNOSIS — Z125 Encounter for screening for malignant neoplasm of prostate: Secondary | ICD-10-CM | POA: Diagnosis not present

## 2015-07-16 DIAGNOSIS — R739 Hyperglycemia, unspecified: Secondary | ICD-10-CM

## 2015-07-16 LAB — COMPREHENSIVE METABOLIC PANEL
ALBUMIN: 4.5 g/dL (ref 3.5–5.2)
ALK PHOS: 81 U/L (ref 39–117)
ALT: 27 U/L (ref 0–53)
AST: 26 U/L (ref 0–37)
BILIRUBIN TOTAL: 0.7 mg/dL (ref 0.2–1.2)
BUN: 21 mg/dL (ref 6–23)
CO2: 29 mEq/L (ref 19–32)
CREATININE: 0.98 mg/dL (ref 0.40–1.50)
Calcium: 9.7 mg/dL (ref 8.4–10.5)
Chloride: 106 mEq/L (ref 96–112)
GFR: 79.55 mL/min (ref >60.00)
GLUCOSE: 124 mg/dL — AB (ref 70–99)
POTASSIUM: 4.7 meq/L (ref 3.5–5.1)
SODIUM: 143 meq/L (ref 135–145)
TOTAL PROTEIN: 7 g/dL (ref 6.0–8.3)

## 2015-07-16 LAB — HEMOGLOBIN A1C: HEMOGLOBIN A1C: 6.1 % (ref 4.6–6.5)

## 2015-07-16 LAB — LIPID PANEL
CHOLESTEROL: 213 mg/dL — AB (ref 0–200)
HDL: 47.7 mg/dL (ref >39.00)
LDL Cholesterol: 144 mg/dL — ABNORMAL HIGH (ref 0–99)
NONHDL: 165.09
Total CHOL/HDL Ratio: 4
Triglycerides: 103 mg/dL (ref 0.0–149.0)
VLDL: 20.6 mg/dL (ref 0.0–40.0)

## 2015-07-16 LAB — PSA, MEDICARE: PSA: 0.56 ng/ml (ref 0.10–4.00)

## 2015-07-23 ENCOUNTER — Encounter: Payer: Self-pay | Admitting: Family Medicine

## 2015-07-23 ENCOUNTER — Ambulatory Visit (INDEPENDENT_AMBULATORY_CARE_PROVIDER_SITE_OTHER): Payer: PPO | Admitting: Family Medicine

## 2015-07-23 VITALS — BP 102/60 | HR 53 | Temp 98.4°F | Ht 73.0 in | Wt 240.5 lb

## 2015-07-23 DIAGNOSIS — Z0000 Encounter for general adult medical examination without abnormal findings: Secondary | ICD-10-CM

## 2015-07-23 DIAGNOSIS — E785 Hyperlipidemia, unspecified: Secondary | ICD-10-CM | POA: Diagnosis not present

## 2015-07-23 DIAGNOSIS — R739 Hyperglycemia, unspecified: Secondary | ICD-10-CM | POA: Diagnosis not present

## 2015-07-23 MED ORDER — PRAVASTATIN SODIUM 80 MG PO TABS: 80.0000 mg | ORAL_TABLET | Freq: Every day | ORAL | Status: AC

## 2015-07-23 NOTE — Patient Instructions (Signed)
Go back to 80mg  pravastatin in the meantime.  Take care.  Glad to see you.

## 2015-07-23 NOTE — Progress Notes (Signed)
Pre visit review using our clinic review tool, if applicable. No additional management support is needed unless otherwise documented below in the visit note.  I have personally reviewed the Medicare Annual Wellness questionnaire and have noted 1. The patient's medical and social history 2. Their use of alcohol, tobacco or illicit drugs 3. Their current medications and supplements 4. The patient's functional ability including ADL's, fall risks, home safety risks and hearing or visual             impairment. 5. Diet and physical activities 6. Evidence for depression or mood disorders  The patients weight, height, BMI have been recorded in the chart and visual acuity is per eye clinic.  I have made referrals, counseling and provided education to the patient based review of the above and I have provided the pt with a written personalized care plan for preventive services.  Provider list updated- see scanned forms.  Routine anticipatory guidance given to patient.  See health maintenance.  Flu 2016 Shingles up to date   PNA up to date.  Tetanus 2010 Colon 2017 Prostate cancer screening- PSA wnl Advance directive - wife designated if patient were incapacitated.  Cognitive function addressed- see scanned forms- and if abnormal then additional documentation follows.   He had cardiac w/u re: bradycardia, with likely high resting vagal tone.  No other CP, SOB, troubles in the meantime.    Elevated Cholesterol: Using medications without problems:yes Muscle aches: some occ stiffness but not better on the lower dose of statin, d/w pt.  His sx are less likely to be from the statin, d/w pt.  Diet compliance:yes Exercise:yes Labs d/w pt.    S/p colectomy w/o abd pain now.    PMH and SH reviewed  Meds, vitals, and allergies reviewed.   ROS: Per HPI.  Unless specifically indicated otherwise in HPI, the patient denies:  General: fever. Eyes: acute vision changes ENT: sore  throat Cardiovascular: chest pain Respiratory: SOB GI: vomiting GU: dysuria Musculoskeletal: acute back pain Derm: acute rash Neuro: acute motor dysfunction Psych: worsening mood Endocrine: polydipsia Heme: bleeding Allergy: hayfever  GEN: nad, alert and oriented HEENT: mucous membranes moist NECK: supple w/o LA CV: rrr. PULM: ctab, no inc wob ABD: soft, +bs, abd scar well healed EXT: no edema SKIN: no acute rash

## 2015-07-24 NOTE — Assessment & Plan Note (Signed)
Labs d/w pt.  Will go back to 80mg  pravastatin in the meantime.  Continue work on diet and exercise.

## 2015-07-24 NOTE — Assessment & Plan Note (Signed)
D/w pt about diet and exercise, recheck sugar in about 3 months.  He agrees.

## 2015-07-24 NOTE — Assessment & Plan Note (Signed)
Flu 2016 Shingles up to date   PNA up to date.  Tetanus 2010 Colon 2017 Prostate cancer screening- PSA wnl Advance directive - wife designated if patient were incapacitated.  Cognitive function addressed- see scanned forms- and if abnormal then additional documentation follows.

## 2015-10-02 IMAGING — RF DG BE LIMITED  W/ CM
15 of 24 series · 15 of 24 positions shown · non-contrast
Comparison: CT abdomen pelvis of 08/11/2014

CLINICAL DATA: Several bouts of diverticulitis, barium enema for
evaluation of extent of diverticular disease

[Series 1: run · 1 of 1 slices shown (1 of 12)]
[im 1/1]
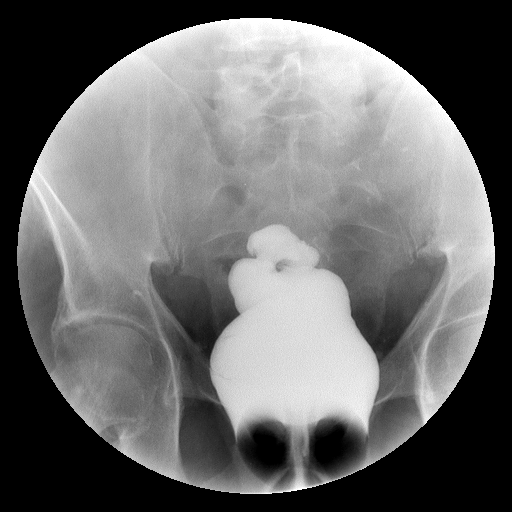

[Series 3: run · 1 of 1 slices shown (2 of 12)]
[im 1/1]
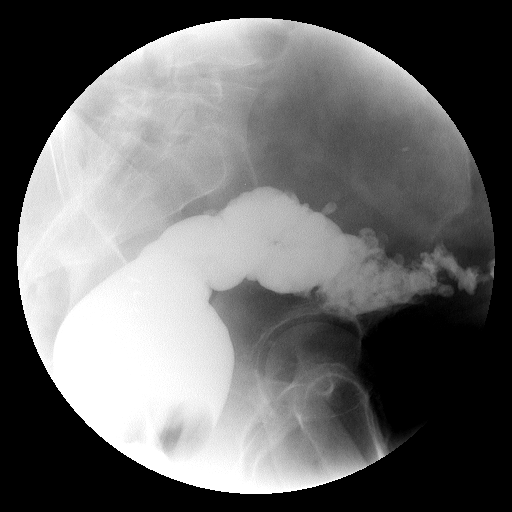

[Series 5: run · 1 of 1 slices shown (3 of 12)]
[im 1/1]
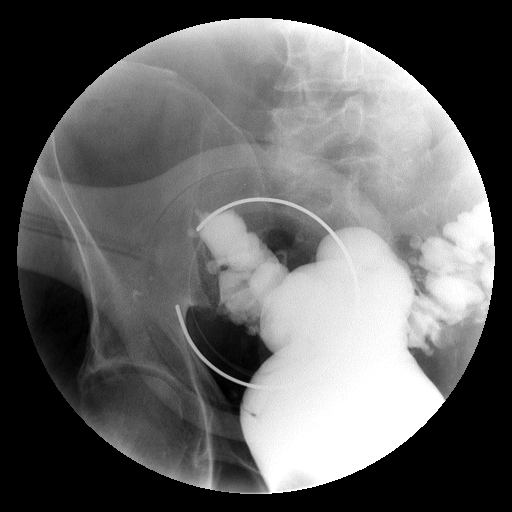

[Series 6: run · 1 of 1 slices shown (4 of 12)]
[im 1/1]
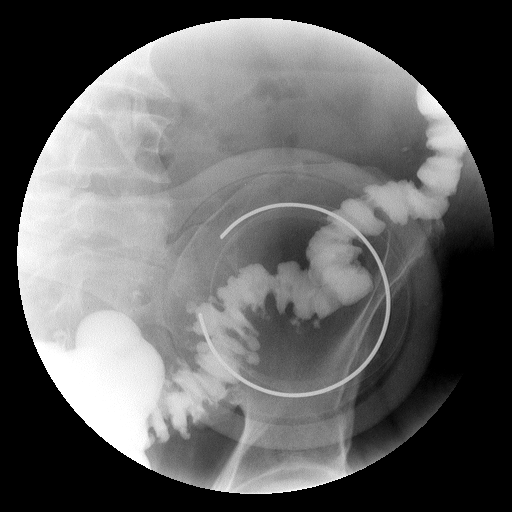

[Series 8: run · 1 of 1 slices shown (5 of 12)]
[im 1/1]
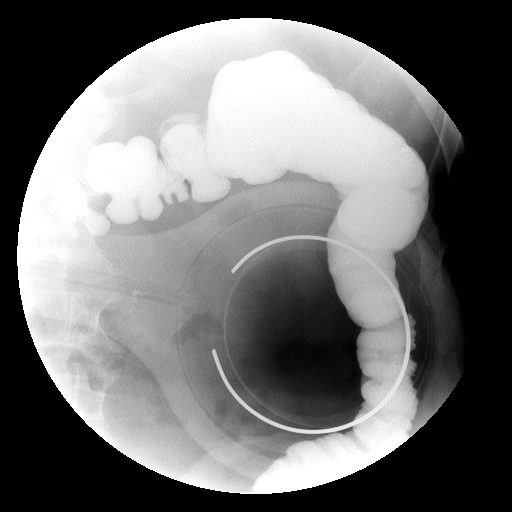

[Series 9: run · 1 of 1 slices shown (6 of 12)]
[im 1/1]
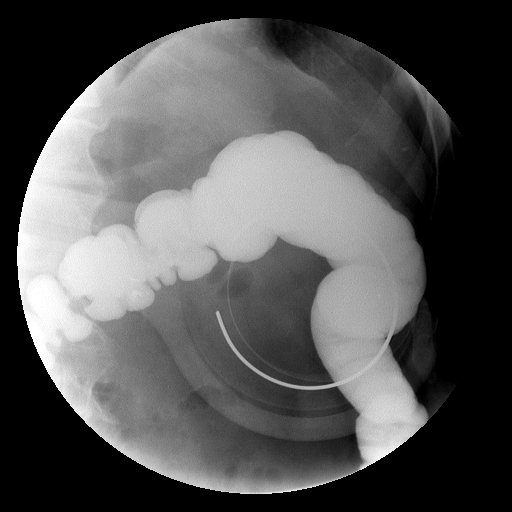

[Series 11: run · 1 of 1 slices shown (7 of 12)]
[im 1/1]
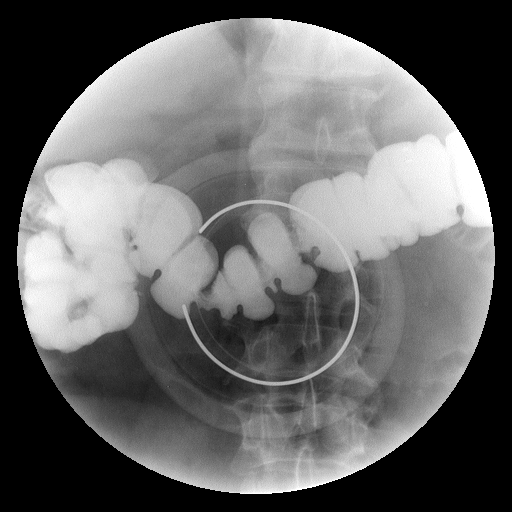

[Series 13: run · 1 of 1 slices shown (8 of 12)]
[im 1/1]
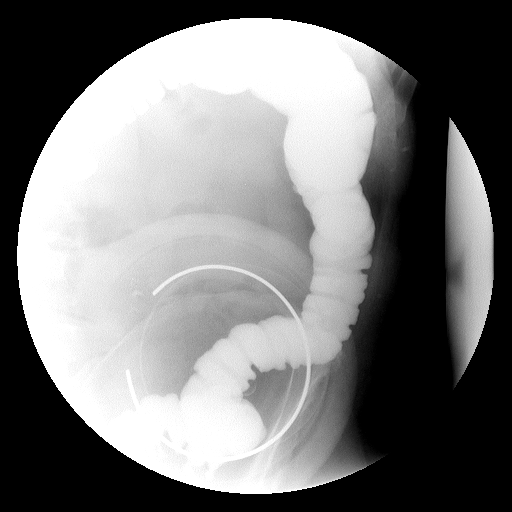

[Series 14: run · 1 of 1 slices shown (9 of 12)]
[im 1/1]
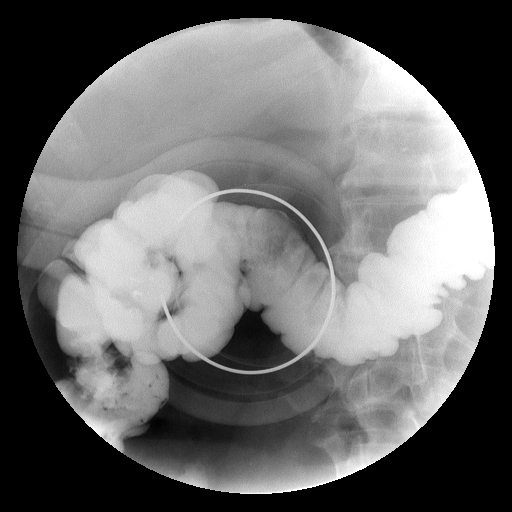

[Series 16: run · 1 of 1 slices shown (10 of 12)]
[im 1/1]
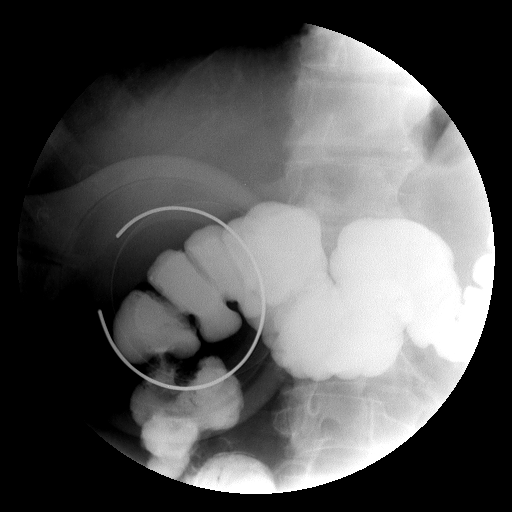

[Series 17: run · 1 of 1 slices shown (11 of 12)]
[im 1/1]
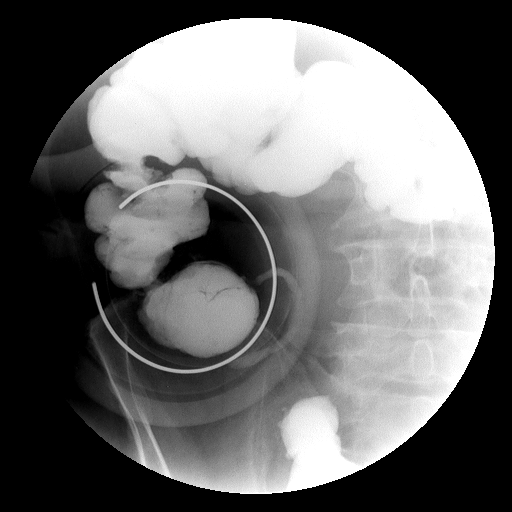

[Series 19: run · 1 of 1 slices shown (12 of 12)]
[im 1/1]
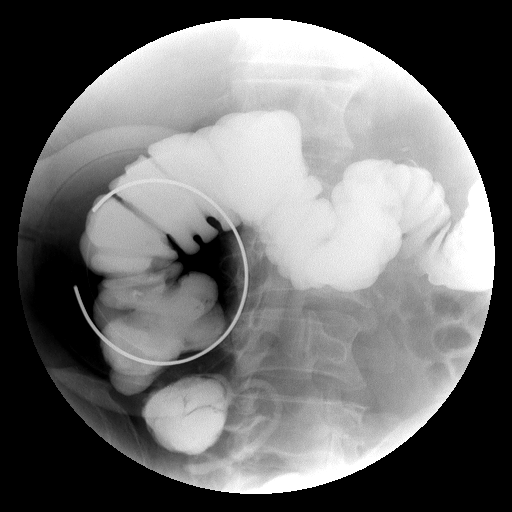

[Series 1002: view not recorded · 0.20mm/px · 1 of 1 slices shown (1 of 3)]
[im 1/1]
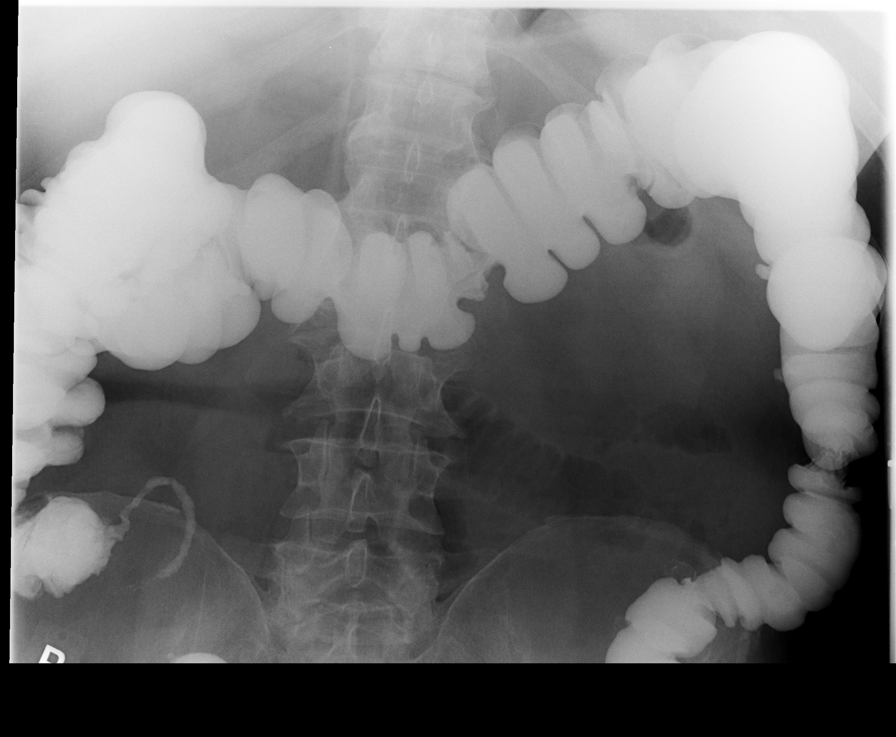

[Series 1003: view not recorded · 0.20mm/px · 1 of 1 slices shown (2 of 3)]
[im 1/1]
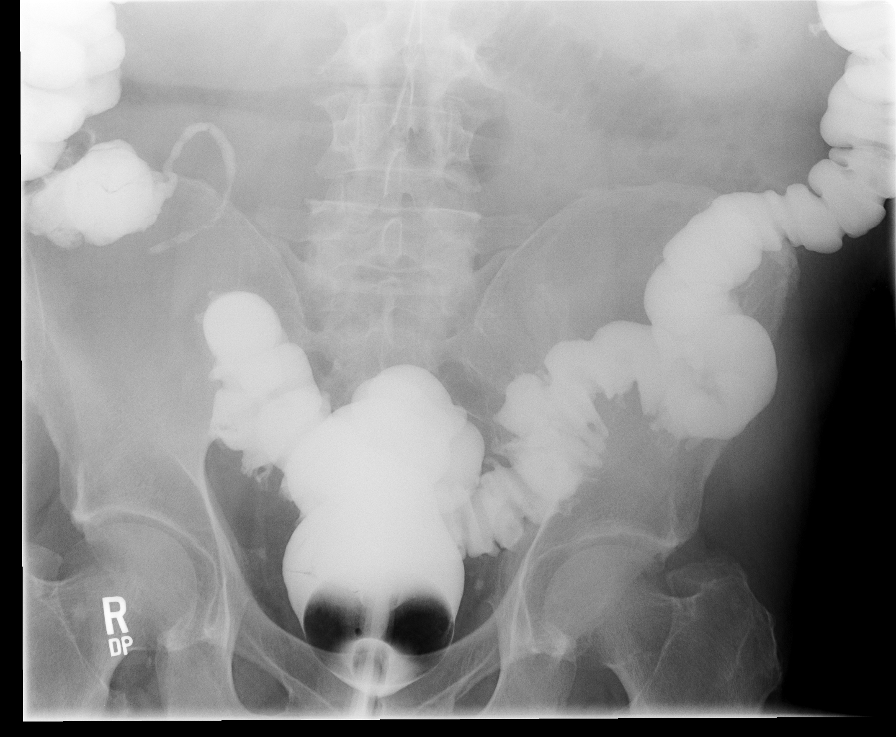

[Series 1005: view not recorded · 0.20mm/px · 1 of 1 slices shown (3 of 3)]
[im 1/1]
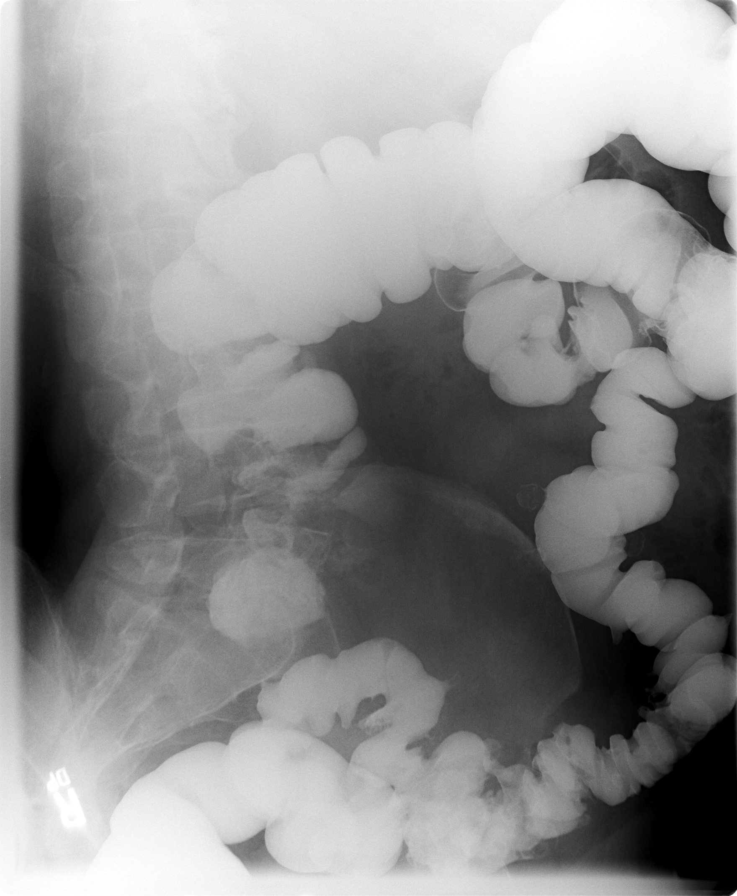

[15 of 24 positions shown; findings below may reference images not displayed]

EXAM:
BE LIMITED WITH CONTRAST

CONTRAST:  Single column barium enema

FLUOROSCOPY TIME:  Radiation Exposure Index (as provided by the
fluoroscopic device): 300 Gy per sq cm

If the device does not provide the exposure index:

Fluoroscopy Time (in minutes and seconds):  2 minutes 42 second

Number of Acquired Images:
FINDINGS: A preliminary film of the abdomen shows a nonspecific bowel gas
pattern. There are degenerative changes diffusely throughout the
lumbar spine. No opaque calculi are noted

A full column barium enema was performed. Barium entered the colon
with some delay as a result of spasm of the rectosigmoid colon.
There are multiple diverticula throughout the rectosigmoid colon
with spasm which resolves during the course of the study. With
manual compression over the entire colon, no persistent polypoid
lesion or constricting lesion is seen. There appears to be 1 or 2
diverticula in the proximal descending colon but no additional
diverticula are noted throughout the descending, transverse, or
ascending colon. The cecum and appendix fill normally. On the post
evacuation film, no other abnormality is seen.
IMPRESSION: Multiple diverticula concentrated in the rectosigmoid colon. Only 1-
2 proximal descending colon diverticula are noted.

## 2015-10-19 ENCOUNTER — Telehealth: Payer: Self-pay | Admitting: Family Medicine

## 2015-10-19 NOTE — Telephone Encounter (Signed)
Patient has moved to [facility]. Patient wants to know if Dr.Duncan can recommend a good PCP at [facility].

## 2015-10-21 NOTE — Telephone Encounter (Signed)
I don't know of anyone there.  I suspect that he would be able to find a clinic associated with [facility] and that would be a reasonable place to start.  I wish him the best, I was always glad to see him here in clinic.  Please remove me as the PCP.   Thanks.

## 2015-10-22 NOTE — Telephone Encounter (Signed)
Patient advised.

## 2015-10-29 ENCOUNTER — Other Ambulatory Visit (INDEPENDENT_AMBULATORY_CARE_PROVIDER_SITE_OTHER): Payer: PPO

## 2015-10-29 ENCOUNTER — Encounter: Payer: Self-pay | Admitting: *Deleted

## 2015-10-29 DIAGNOSIS — R739 Hyperglycemia, unspecified: Secondary | ICD-10-CM

## 2015-10-29 DIAGNOSIS — Z23 Encounter for immunization: Secondary | ICD-10-CM

## 2015-10-29 LAB — HEMOGLOBIN A1C: HEMOGLOBIN A1C: 5.9 % (ref 4.6–6.5)

## 2015-10-29 LAB — GLUCOSE, RANDOM: Glucose, Bld: 113 mg/dL — ABNORMAL HIGH (ref 70–99)

## 2020-05-17 ENCOUNTER — Encounter: Payer: Self-pay | Admitting: Gastroenterology
